# Patient Record
Sex: Female | Born: 1965 | Race: Black or African American | Hispanic: No | Marital: Married | Smoking: Never smoker
Health system: Southern US, Community
[De-identification: ages and names within clinical notes are randomized; demographics above are authoritative.]

## PROBLEM LIST (undated history)

## (undated) DIAGNOSIS — E785 Hyperlipidemia, unspecified: Secondary | ICD-10-CM

## (undated) DIAGNOSIS — G5603 Carpal tunnel syndrome, bilateral upper limbs: Secondary | ICD-10-CM

## (undated) DIAGNOSIS — L709 Acne, unspecified: Secondary | ICD-10-CM

## (undated) DIAGNOSIS — N6019 Diffuse cystic mastopathy of unspecified breast: Secondary | ICD-10-CM

## (undated) DIAGNOSIS — D649 Anemia, unspecified: Secondary | ICD-10-CM

## (undated) DIAGNOSIS — R1310 Dysphagia, unspecified: Secondary | ICD-10-CM

## (undated) DIAGNOSIS — T7840XA Allergy, unspecified, initial encounter: Secondary | ICD-10-CM

## (undated) DIAGNOSIS — Z8742 Personal history of other diseases of the female genital tract: Secondary | ICD-10-CM

## (undated) DIAGNOSIS — M7060 Trochanteric bursitis, unspecified hip: Secondary | ICD-10-CM

## (undated) HISTORY — DX: Anemia, unspecified: D64.9

## (undated) HISTORY — DX: Personal history of other diseases of the female genital tract: Z87.42

## (undated) HISTORY — DX: Acne, unspecified: L70.9

## (undated) HISTORY — DX: Hyperlipidemia, unspecified: E78.5

## (undated) HISTORY — DX: Allergy, unspecified, initial encounter: T78.40XA

## (undated) HISTORY — PX: CARPAL TUNNEL RELEASE: SHX101

## (undated) HISTORY — DX: Diffuse cystic mastopathy of unspecified breast: N60.19

## (undated) HISTORY — DX: Carpal tunnel syndrome, bilateral upper limbs: G56.03

## (undated) HISTORY — DX: Trochanteric bursitis, unspecified hip: M70.60

## (undated) HISTORY — PX: BREAST BIOPSY: SHX20

## (undated) HISTORY — DX: Dysphagia, unspecified: R13.10

---
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
## 1986-12-19 HISTORY — PX: APPENDECTOMY: SHX54

## 2000-09-21 ENCOUNTER — Encounter: Payer: Self-pay | Admitting: Obstetrics and Gynecology

## 2000-09-21 ENCOUNTER — Ambulatory Visit (HOSPITAL_COMMUNITY): Admission: RE | Admit: 2000-09-21 | Discharge: 2000-09-21 | Payer: Self-pay | Admitting: Obstetrics and Gynecology

## 2000-11-13 ENCOUNTER — Encounter: Payer: Self-pay | Admitting: Obstetrics and Gynecology

## 2000-11-13 ENCOUNTER — Ambulatory Visit (HOSPITAL_COMMUNITY): Admission: RE | Admit: 2000-11-13 | Discharge: 2000-11-13 | Payer: Self-pay | Admitting: Obstetrics and Gynecology

## 2000-12-22 ENCOUNTER — Ambulatory Visit (HOSPITAL_COMMUNITY): Admission: RE | Admit: 2000-12-22 | Discharge: 2000-12-22 | Payer: Self-pay | Admitting: Obstetrics and Gynecology

## 2000-12-22 ENCOUNTER — Encounter: Payer: Self-pay | Admitting: Obstetrics and Gynecology

## 2001-01-15 ENCOUNTER — Ambulatory Visit (HOSPITAL_COMMUNITY): Admission: RE | Admit: 2001-01-15 | Discharge: 2001-01-15 | Payer: Self-pay | Admitting: Obstetrics and Gynecology

## 2001-01-15 ENCOUNTER — Encounter: Payer: Self-pay | Admitting: Obstetrics and Gynecology

## 2001-01-19 ENCOUNTER — Inpatient Hospital Stay (HOSPITAL_COMMUNITY): Admission: AD | Admit: 2001-01-19 | Discharge: 2001-01-26 | Payer: Self-pay | Admitting: Obstetrics and Gynecology

## 2001-01-29 ENCOUNTER — Encounter: Admission: RE | Admit: 2001-01-29 | Discharge: 2001-02-28 | Payer: Self-pay | Admitting: Obstetrics and Gynecology

## 2002-04-12 ENCOUNTER — Encounter: Admission: RE | Admit: 2002-04-12 | Discharge: 2002-04-12 | Payer: Self-pay | Admitting: Family Medicine

## 2002-04-12 ENCOUNTER — Encounter: Payer: Self-pay | Admitting: Family Medicine

## 2003-04-22 ENCOUNTER — Encounter: Admission: RE | Admit: 2003-04-22 | Discharge: 2003-07-21 | Payer: Self-pay | Admitting: Endocrinology

## 2003-07-28 ENCOUNTER — Other Ambulatory Visit: Admission: RE | Admit: 2003-07-28 | Discharge: 2003-07-28 | Payer: Self-pay | Admitting: Obstetrics and Gynecology

## 2003-08-04 ENCOUNTER — Encounter: Admission: RE | Admit: 2003-08-04 | Discharge: 2003-11-02 | Payer: Self-pay | Admitting: Endocrinology

## 2003-12-20 HISTORY — PX: TUBAL LIGATION: SHX77

## 2004-02-16 ENCOUNTER — Ambulatory Visit (HOSPITAL_COMMUNITY): Admission: RE | Admit: 2004-02-16 | Discharge: 2004-02-16 | Payer: Self-pay | Admitting: Obstetrics and Gynecology

## 2004-04-02 ENCOUNTER — Ambulatory Visit (HOSPITAL_COMMUNITY): Admission: RE | Admit: 2004-04-02 | Discharge: 2004-04-02 | Payer: Self-pay | Admitting: Obstetrics and Gynecology

## 2004-06-04 ENCOUNTER — Observation Stay (HOSPITAL_COMMUNITY): Admission: AD | Admit: 2004-06-04 | Discharge: 2004-06-05 | Payer: Self-pay | Admitting: Obstetrics and Gynecology

## 2004-07-02 ENCOUNTER — Inpatient Hospital Stay (HOSPITAL_COMMUNITY): Admission: RE | Admit: 2004-07-02 | Discharge: 2004-07-05 | Payer: Self-pay | Admitting: Obstetrics and Gynecology

## 2004-07-02 ENCOUNTER — Encounter (INDEPENDENT_AMBULATORY_CARE_PROVIDER_SITE_OTHER): Payer: Self-pay | Admitting: Specialist

## 2004-07-06 ENCOUNTER — Observation Stay (HOSPITAL_COMMUNITY): Admission: AD | Admit: 2004-07-06 | Discharge: 2004-07-06 | Payer: Self-pay | Admitting: Obstetrics and Gynecology

## 2004-08-10 ENCOUNTER — Other Ambulatory Visit: Admission: RE | Admit: 2004-08-10 | Discharge: 2004-08-10 | Payer: Self-pay | Admitting: Obstetrics and Gynecology

## 2004-12-15 ENCOUNTER — Encounter: Admission: RE | Admit: 2004-12-15 | Discharge: 2004-12-15 | Payer: Self-pay | Admitting: Family Medicine

## 2005-11-14 ENCOUNTER — Other Ambulatory Visit: Admission: RE | Admit: 2005-11-14 | Discharge: 2005-11-14 | Payer: Self-pay | Admitting: Obstetrics and Gynecology

## 2006-07-17 ENCOUNTER — Other Ambulatory Visit: Admission: RE | Admit: 2006-07-17 | Discharge: 2006-07-17 | Payer: Self-pay | Admitting: Family Medicine

## 2006-08-11 ENCOUNTER — Encounter: Admission: RE | Admit: 2006-08-11 | Discharge: 2006-08-11 | Payer: Self-pay | Admitting: Family Medicine

## 2007-05-09 ENCOUNTER — Emergency Department (HOSPITAL_COMMUNITY): Admission: EM | Admit: 2007-05-09 | Discharge: 2007-05-10 | Payer: Self-pay | Admitting: Emergency Medicine

## 2007-08-01 ENCOUNTER — Other Ambulatory Visit: Admission: RE | Admit: 2007-08-01 | Discharge: 2007-08-01 | Payer: Self-pay | Admitting: Family Medicine

## 2007-11-02 ENCOUNTER — Encounter: Admission: RE | Admit: 2007-11-02 | Discharge: 2007-11-02 | Payer: Self-pay | Admitting: Family Medicine

## 2008-08-06 ENCOUNTER — Encounter: Admission: RE | Admit: 2008-08-06 | Discharge: 2008-08-06 | Payer: Self-pay | Admitting: Family Medicine

## 2008-09-18 DIAGNOSIS — M7060 Trochanteric bursitis, unspecified hip: Secondary | ICD-10-CM

## 2008-09-18 HISTORY — DX: Trochanteric bursitis, unspecified hip: M70.60

## 2008-10-08 ENCOUNTER — Other Ambulatory Visit: Admission: RE | Admit: 2008-10-08 | Discharge: 2008-10-08 | Payer: Self-pay | Admitting: Family Medicine

## 2008-10-19 DIAGNOSIS — D649 Anemia, unspecified: Secondary | ICD-10-CM

## 2008-10-19 HISTORY — DX: Anemia, unspecified: D64.9

## 2008-11-04 ENCOUNTER — Encounter: Admission: RE | Admit: 2008-11-04 | Discharge: 2008-11-04 | Payer: Self-pay | Admitting: Family Medicine

## 2009-05-27 ENCOUNTER — Encounter: Admission: RE | Admit: 2009-05-27 | Discharge: 2009-06-24 | Payer: Self-pay | Admitting: Endocrinology

## 2009-07-19 DIAGNOSIS — L709 Acne, unspecified: Secondary | ICD-10-CM

## 2009-07-19 HISTORY — DX: Acne, unspecified: L70.9

## 2009-09-02 ENCOUNTER — Encounter: Admission: RE | Admit: 2009-09-02 | Discharge: 2009-09-02 | Payer: Self-pay | Admitting: Endocrinology

## 2009-10-21 ENCOUNTER — Other Ambulatory Visit: Admission: RE | Admit: 2009-10-21 | Discharge: 2009-10-21 | Payer: Self-pay | Admitting: Family Medicine

## 2009-10-21 LAB — HM PAP SMEAR: HM Pap smear: NORMAL

## 2009-11-05 ENCOUNTER — Encounter: Admission: RE | Admit: 2009-11-05 | Discharge: 2009-11-05 | Payer: Self-pay | Admitting: Family Medicine

## 2009-11-17 ENCOUNTER — Encounter: Admission: RE | Admit: 2009-11-17 | Discharge: 2009-11-17 | Payer: Self-pay | Admitting: Family Medicine

## 2009-11-18 DIAGNOSIS — N6019 Diffuse cystic mastopathy of unspecified breast: Secondary | ICD-10-CM

## 2009-11-18 HISTORY — DX: Diffuse cystic mastopathy of unspecified breast: N60.19

## 2009-11-25 ENCOUNTER — Encounter: Admission: RE | Admit: 2009-11-25 | Discharge: 2009-11-25 | Payer: Self-pay | Admitting: Family Medicine

## 2009-12-19 HISTORY — PX: COLONOSCOPY: SHX174

## 2010-03-02 ENCOUNTER — Encounter: Admission: RE | Admit: 2010-03-02 | Discharge: 2010-03-02 | Payer: Self-pay | Admitting: Family Medicine

## 2010-08-17 ENCOUNTER — Encounter: Admission: RE | Admit: 2010-08-17 | Discharge: 2010-08-17 | Payer: Self-pay | Admitting: Gastroenterology

## 2010-09-18 HISTORY — PX: ENDOMETRIAL ABLATION: SHX621

## 2010-09-29 ENCOUNTER — Ambulatory Visit (HOSPITAL_COMMUNITY): Admission: RE | Admit: 2010-09-29 | Discharge: 2010-09-29 | Payer: Self-pay | Admitting: Obstetrics and Gynecology

## 2010-11-19 ENCOUNTER — Encounter: Admission: RE | Admit: 2010-11-19 | Discharge: 2010-11-19 | Payer: Self-pay | Admitting: Family Medicine

## 2010-11-19 LAB — HM MAMMOGRAPHY

## 2011-01-09 ENCOUNTER — Encounter: Payer: Self-pay | Admitting: Family Medicine

## 2011-03-02 LAB — GLUCOSE, CAPILLARY: Glucose-Capillary: 90 mg/dL (ref 70–99)

## 2011-03-03 LAB — BASIC METABOLIC PANEL
BUN: 10 mg/dL (ref 6–23)
Calcium: 9.4 mg/dL (ref 8.4–10.5)
GFR calc non Af Amer: >60 mL/min (ref >60)
Glucose, Bld: 98 mg/dL (ref 70–99)
Potassium: 4.6 mEq/L (ref 3.5–5.1)
Sodium: 140 mEq/L (ref 135–145)

## 2011-03-03 LAB — CBC
HCT: 38.1 % (ref 36.0–46.0)
MCHC: 33.3 g/dL (ref 30.0–36.0)
RDW: 22 % — ABNORMAL HIGH (ref 11.5–15.5)
WBC: 6.6 10*3/uL (ref 4.0–10.5)

## 2011-05-06 NOTE — Op Note (Signed)
[facility]  Patient:    Vanessa [name], Vanessa [name]                      MRN: [id number] Proc. Date: 01/22/01 Adm. Date:  [id number] Attending:  Leonard Schwartz                           Operative Report  PREOPERATIVE DIAGNOSES:       1. A 37-week gestation.                               2. Diabetes.                               3. Late decelerations on nonstress test.  POSTOPERATIVE DIAGNOSES:      1. A 37-week gestation.                               2. Diabetes.                               3. Late decelerations on nonstress test.  PROCEDURE:                    Amniocentesis for maturity studies.  OBSTETRICIAN:                 Janine Limbo, M.D.  ANESTHETIC:                   Local, Xylocaine.  DISPOSITION:                  [name] is a 45 year old female, gravida 1, para 0, who presents at [redacted] weeks gestation. She had a routine nonstress test that showed late decelerations. Contractions were promoted and the patient again had some late decelerations. The decision was made to proceed with amniocentesis to determine whether or not delivery was appropriate at this gestation. The patient understands the indications for her procedure and she accepts the associated risk.  FINDINGS:                     The patients blood type is A positive. An ultrasound was performed that showed a single intrauterine gestation in cephalic position. The amniotic fluid volume was normal. There were normal fetal heart motions. The placenta was grade 2. A total of 20 cc of clear amniotic fluid was removed. It had a slight amount of vernix.  DESCRIPTION OF PROCEDURE:     An ultrasound was performed and an appropriate fluid pocket was isolated. The patients abdomen was prepped with multiple layers of Betadine and then sterilely draped. Two cc of 1% Xylocaine were instilled in the skin. The spinal needle was placed into the amniotic cavity without difficulty and 20  cc of clear fluid were removed. The patient tolerated her procedure well. A repeat ultrasound was performed and there was no harm noted to the fetus. The fetal heart motion, again, was noted to be normal. A nonstress test was performed and then nonstress test was reactive. The fluid was sent to [facility] for an LS ratio. DD:  01/22/01 TD:  01/23/01 Job: [id number]

## 2011-05-06 NOTE — H&P (Signed)
NAMEGAGE, TREIBER                         ACCOUNT NO.:  [id number]   MEDICAL RECORD NO.:  [id number]                   PATIENT TYPE:  INP   LOCATION:  9158                                 FACILITY:  [facility]   PHYSICIAN:  Hal Morales, M.D.             DATE OF BIRTH:  21-Aug-1966   DATE OF ADMISSION:  06/04/2004  DATE OF DISCHARGE:                                HISTORY & PHYSICAL   HISTORY OF PRESENT ILLNESS:  Ms. Rueckert is a 45 year old gravida 2, para 1-0-0-  1 at 34 weeks, who presented with the onset of nausea, vomiting and diarrhea  approximately 4 p.m.  She denied any viral or food exposure.  She reports  pain in the abdomen, status post vomiting.  She was seen at [facility] today with her cervix closed and long.  Questions cultures were done.  She was treated for BV.  She denies fever, headache, URI symptoms, dysuria,  bleeding or leaking.  She reports positive fetal movement.  Pregnancy has  been remarkable for:  (1) Insulin-dependent diabetes diagnosed in 2000; (2)  previous cesarean section with possible plan for repeat; (3) questionable  last menstrual period.   PRENATAL LABORATORIES:  Blood type is A-positive, Rh antibody negative.  VDRL nonreactive.  Rubella titer positive.  Hepatitis B surface antigen  negative.  HIV nonreactive.  GC and Chlamydia cultures were negative in  August.  Pap was normal.  Varicella titer was noted to be positive.  Hemoglobin on entry into practice was 13.7; it was not reevaluated during  her pregnancy.  EDC of July 17, 2004 was established by ultrasound at 10  weeks secondary to questionable LMP and was in agreement with ultrasound at  approximately 18 weeks.   HISTORY OF PRESENT PREGNANCY:  The patient entered care at approximately 10  weeks.  She had an ultrasound at that time that showed an IUP with an [facility] of  July 17, 2004.  Her diabetes had been followed by Dr. Dorisann Frames.  At  the time of the beginning of her pregnancy,  she was taking 15 units of NPH  at bedtime.  She began then to check her sugars again with fasting blood  sugar, 2-hour p.c.'s, and hemoglobin A1c on December 05, 2003 had been in  the range of 5.  She declined amniocentesis.  She had some back pain at 15  weeks.  Blood sugars remained within reasonable range, but she was using an  insulin pen, 75/25 with 15 of insulin before dinner, again 75/25 with 5  units at breakfast and Humalog 3 units at lunch.  Dr. Talmage Nap was working to  get an insulin pump through [employer] but did have some  resistance.  She was still having some sciatic pain.  A [facility] referral was  accomplished.  She did have an abnormal quad screen which showed a 1-in-45  risk for neural tube defect.  Spine was seen well on an 18-week ultrasound,  as well as an anterior abdominal wall; this, in consult with the  radiologist, decreased her risk of neural tube defect by 97%.  Down's  syndrome risk was 1 in 240; based upon ultrasound, the risk of Down's  syndrome was changed to 1 in 598 and trisomy risk was 1 in 800.  Recalculation of neural tube defect risk was 1 in 80.  She had another  ultrasound at 25 weeks that showed growth at the 50th to 75th percentile  with size equal to dates and normal fluid.  Her cervix was also 3.9.  She  was continuing her insulin regimen and her values of blood sugars were  essentially normal.  Hemoglobin A1c was done in March 2005 and was 5.3 per  the patient; it had been 5.8 back in December.  Another ultrasound was done  at 29 weeks, again showing normal fluid and other values.  Cervix was 3.1  cm.  She was seen in the office today for a regular visit with her cervix  checked, which was closed and long, and treatment was initiated for  bacterial vaginosis.   OBSTETRICAL HISTORY:  In 2002, she had a primary low transverse cesarean  section for a female infant -- weight 6 pounds 8 ounces -- at 37-1/7 weeks;  she had epidural anesthesia.   Induction of labor was attempted but the non-  reassuring fetal heart rate occurred.   MEDICAL HISTORY:  She was on Ortho Tri-Cyclen but stopped prior to  pregnancy.  She reports the usual childhood illnesses, although she was  unsure about her varicella status.  She has occasional yeast infections.  She had been diagnosed with insulin-dependent diabetes in 2000; Dr. Talmage Nap  was her endocrinologist.  She had been on NPH prior to pregnancy.  She also  had a broken clavicle as a child and a broken nose in 1994.  She had an  appendectomy in 1987.   ALLERGIES:  She has no known medication allergies.   FAMILY HISTORY:  Her father had an MI.  Her maternal grandmother was a diet-  controlled diabetic.   GENETIC HISTORY:  Genetic history is remarkable for the patient being age  83.  She had a maternal aunt and cousin that had muscular dystrophy and a  cousin who had sickle cell trait.   SOCIAL HISTORY:  The patient is married to the father of the baby; he is  involved and supportive; his name is Malay Fantroy.  The patient is college-  educated; she is employed as a Designer, television/film set.  Her partner has some  education beyond high school and he is employed with [employer].  She has been  followed by the physician service at [facility].  She denies any  alcohol, drug or tobacco use during this pregnancy.   PHYSICAL EXAM:  VITAL SIGNS:  Blood pressures are 120s to 130s over 72 to  82.  Pulse is 84.  Respirations 20.  Temperature is 97.5.  ABDOMEN:  Fetal monitoring shows reactive tracing with no decelerations.  Occasional uterine contractions were noted initially, then went through  approximately 40 mg of every 5 minutes, now spacing out again.  Abdomen was  soft and nontender, negative rebound and guarding and gravid.  Negative CVA  tenderness was noted.  PELVIC:  Cervix was closed, long and vertex -1, slightly ballotable. EXTREMITIES:  Extremities are within normal limits with deep tendon  reflexes  2+ without clonus.  There  is a trace edema noted.   LABORATORIES:  Labs today:  Clean-catch urine shows specific gravity of  1.025, greater than 80 ketones, 100 of glucose and a negative microscopy.  CBC showed hemoglobin of 12.4, hematocrit of 37.7, white blood cell count of  12.3 and platelets of 293,000; differential showed neutrophils of 85,  absolute granulocytes at 10.5 and lymphocytes at 11.  Comprehensive  metabolic panel was normal except for potassium of 3.3.  Lipase and amylase  are currently pending.   IMPRESSION:  1. Intrauterine pregnancy at 34 weeks.  2. Nausea, vomiting and diarrhea.  3. Insulin-dependent diabetes.  4. Mild hypokalemia.  5. Leukocytosis with a left shift.   PLAN:  1. Admit for 23-hour observation per consult with Dr. Hal Morales as     attending physician.  2. Continuous electronic fetal monitoring.  3. Glucomander orders.  4. IV normal saline with 20 mEq of Kay Ciel per liter at 150 mL per hour.  5. Gallbladder ultrasound in the morning.  6. Zofran 4-8 mg q.8 h. p.r.n. nausea.  7. Blood cultures for temperature greater than or equal to 101.  8. CBC with differential in the morning.  9. M.D.s will follow.     Renaldo Reel Emilee Hero, C.N.M.                   Hal Morales, M.D.    Leeanne Mannan  D:  06/04/2004  T:  06/05/2004  Job:  [id number]

## 2011-05-06 NOTE — H&P (Signed)
NAMESHAYRA, ANTON                         ACCOUNT NO.:  [id number]   MEDICAL RECORD NO.:  [id number]                   PATIENT TYPE:  INP   LOCATION:  NA                                   [facility]ACILITY[facility]  [facility]   PHYSICIAN:  Hal Morales, M.D.             DATE OF BIRTH:  1966-06-26   DATE OF ADMISSION:  DATE OF DISCHARGE:                                HISTORY & PHYSICAL   HISTORY OF PRESENT ILLNESS:  This is a 45 year old gravida 2, para 1-0-0-1  at 37-6/7 weeks who presents for repeat cesarean section.  Pregnancy has  been followed by Dr. Pennie Rushing and remarkable for (1) AMA, (2) previous C  section, (3) insulin-dependent diabetes, (4) unsure dates.   HISTORY OF CURRENT PREGNANCY:  The patient in her care at [redacted] weeks  gestation.  She was previously followed by Dr. Talmage Nap for Endocrinology.  Sugars were well maintained throughout the pregnancy.  Attempts were made to  get an insulin pump for the patient, but resistance was met with insurance  companies.  Her quad screen demonstrated increased risk of open neural tube  defects with a risk of 1/80.  Amniocentesis was declined.  She was admitted  overnight for gastroenteritis in June and did well the rest of the  pregnancy.   PAST OBSTETRICS HISTORY:  Remarkable for cesarean delivery in 2002 of a female  infant at [redacted] weeks gestation weighing 6 pounds 8 ounces for a nonreassuring  fetal heart rate.   PAST MEDICAL HISTORY:  1. Childhood varicella.  2. Pregestational insulin-dependent diabetes.   FAMILY HISTORY:  Father with MI and grandmother with diet controlled  diabetes.   PAST SURGICAL HISTORY:  1. Broken clavicle as a child and broken nose in 1994.  2. Appendectomy in 1987.   GENETIC HISTORY:  Remarkable for the patient's age of 89.  Cousin with  muscular dystrophy.  Cousin with sickle cell trait.   SOCIAL HISTORY:  The patient is married to Marta Antu who is involved and  supportive.  She is of the [facility].  She denies  any alcohol, tobacco  or drug use.   PRENATAL LABORATORIES:  Hemoglobin 13.7, platelets 291,000, blood type A  positive, antibody screen negative, RPR nonreactive, rubella immune,  hepatitis negative, HIV negative, Pap test benign (reactive changes),  gonorrhea negative, chlamydia negative, varicella immune.   ASSESSMENT:  1. Intrauterine pregnancy at 37-6/7 weeks.  2. Pregestational insulin-dependent diabetes.  3. Previous C section, desires repeat.   PLAN:  Admit to operating suites per Dr. Pennie Rushing.  Further orders to follow.     Marie L. Williams, C.N.M.                 Hal Morales, M.D.    MLW/MEDQ  D:  07/02/2004  T:  07/02/2004  Job:  [id number]

## 2011-05-06 NOTE — Op Note (Signed)
NAMECAROL, Vanessa [name]                         ACCOUNT NO.:  [id number]   MEDICAL RECORD NO.:  [id number]                   PATIENT TYPE:  INP   LOCATION:  9113                                 [facility]ACILITY:  [facility]   PHYSICIAN:  Hal Morales, M.D.             DATE OF BIRTH:  Apr 25, 1966   DATE OF PROCEDURE:  07/02/2004  DATE OF DISCHARGE:                                 OPERATIVE REPORT   PREOPERATIVE DIAGNOSES:  1. Intrauterine pregnancy at term.  2. Insulin dependent diabetes.  3. Prior cesarean section with desire for repeat cesarean section.  4. Desire for surgical sterilization.   POSTOPERATIVE DIAGNOSES:  1. Intrauterine pregnancy at term.  2. Insulin dependent diabetes.  3. Prior cesarean section with desire for repeat cesarean section.  4. Desire for surgical sterilization.   PROCEDURE:  Repeat low transverse cesarean section and bilateral tubal  sterilization.   ANESTHESIA:  Spinal.   ESTIMATED BLOOD LOSS:  Less than 750 mL.   COMPLICATIONS:  None.   FINDINGS:  The uterus, tubes and ovaries were normal for the gravid state.  The patient delivered a 6 pound 14 ounce female infant with Apgars of 9 and  9 at one and five minutes, respectively.  The placenta contained an  eccentrically inserted three-vessel cord.   SURGEON:  Hal Morales, M.D.   FIRST ASSISTANT:  Marie L. Williams, C.N.M.   DESCRIPTION OF PROCEDURE:  The patient was taken to the operating room after  appropriate identification and placed on the operating table.  After  placement of a spinal anesthetic, she was placed in the supine position with  a left lateral tilt.  The abdomen and perineum were prepped with multiple  layers of Betadine and a Foley catheter inserted into the bladder and  connected to straight drainage.  The abdomen was draped as a sterile field.  After assurance of adequate spinal anesthesia, the suprapubic region of the  incision was infiltrated with 20 mL of 0.25% Marcaine.   A transverse  incision was made at the site of the previous cesarean section incision and  the abdomen opened in layers.  The peritoneum was entered and the bladder  blade placed.  The uterus was incised approximately 2 cm above the  ureterovesical fold and that incision taken laterally on either side  bluntly.  The infant was delivered from the occiput transverse position and  after having the nares and pharynx suctioned and the cord clamped and cut,  was handed off to the awaiting pediatricians.  The appropriate cord blood  was drawn and the placenta noted to have separated from the uterus and was  removed from the operative field.  Uterine incision was closed with a  running interlocking suture of 0 Vicryl.  An imbricating suture of 0 Vicryl  was placed and hemostasis was adequate.  The left fallopian tube was  identified, followed to its fimbriated end, and grasped at  the isthmic  portion and elevated.  A suture of 2-0 chromic was placed through the  mesosalpinx and tied fore and aft on the knuckle of the isthmic tube and a  second ligature placed proximal to that.  The intervening knuckle of tube  was excised and the cut ends cauterized.  A similar procedure was carried  out on the opposite side.  Hemostasis was noted to be adequate.  The  abdominal peritoneum was closed with running suture of 2-0 Vicryl.  The  rectus muscles were reapproximated in the midline with a figure-of-eight  suture of 2-0 Vicryl.  The rectus fascia was closed with a running suture of  0 Vicryl, then reinforced on either side of midline with figure-of-eight  sutures of 0 Vicryl. The subcutaneous tissue was copiously irrigated and  made hemostatic with Bovie cautery.  The skin incision was reapproximated  with a subcuticular suture of 3-0 Monocryl. Steri-Strips were applied.  A  sterile dressing was applied.  The patient was then taken from the operating  room to the recovery room in satisfactory condition,  having tolerated the  procedure well with sponge and instrument counts correct.  The infant went  to the full-term nursery.   SPECIMENS:  Portions of right and left fallopian tube.                                               Hal Morales, M.D.    VPH/MEDQ  D:  07/02/2004  T:  07/02/2004  Job:  [id number]

## 2011-05-06 NOTE — Discharge Summary (Signed)
Vanessa [name], Vanessa [name]                         ACCOUNT NO.:  [id number]   MEDICAL RECORD NO.:  [id number]                   PATIENT TYPE:  INP   LOCATION:  9158                                 [facility]   PHYSICIAN:  Hal Morales, M.D.             DATE OF BIRTH:  1966-09-22   DATE OF ADMISSION:  06/04/2004  DATE OF DISCHARGE:  06/05/2004                                 DISCHARGE SUMMARY   ADMISSION DIAGNOSES:  1. Intrauterine pregnancy at 34 weeks.  2. Nausea, vomiting, and diarrhea.  3. Insulin-dependent diabetes.  4. Mild hypokalemia.   DISCHARGE DIAGNOSES:  1. Intrauterine pregnancy at 34 weeks.  2. Resolving nausea, vomiting, and diarrhea.  3. Insulin-dependent diabetes mellitus.   HOSPITAL COURSE:  [name] is a 45 year old gravida 2 para 1-0-0-1 at 68  weeks who presented with onset of nausea, vomiting, and diarrhea at  approximately 5 p.m. on June 04, 2004.  She denied viral or food exposures.  Her pregnancy had been followed by the [facility] OB/GYN M.D. service  and had been remarkable for:  1. Insulin-dependent diabetes diagnosed in 2000.  2. Previous C-section.  3. Questionable last menstrual period.  4. Advanced maternal age.   Her fetal heart rate was reactive and reassuring with occasional uterine  contractions.  Cervix was closed.  Clean-catch urinalysis was remarkable for  specific gravity of 1.025 and greater than 80 of ketones and 100 glucose.  CBC:  White blood cell count was 12.3, hematocrit 12.4.  Differential showed  leukocytosis with a left shift, neutrophils were 85, lymph was 11.  Potassium was 3.3.  She was started on IV fluids of normal saline with 20  mEq of potassium at 150/hour and was also put on Glucomander protocol.  She  was to be n.p.o. after midnight in preparation for gallbladder ultrasound in  the morning.  Her vital signs were stable and she was afebrile.  On hospital  day #1 she was feeling better.  She had eaten breakfast  and had not had any  nausea and vomiting for approximately a hour afterwards.  Fetal heart rate  was reactive and reassuring.  She was not having contractions, leaking, or  bleeding.  Her vital signs remained stable; she was afebrile.  Clean-catch  urinalysis showed 40 of ketones and specific gravity of 1.020.  White blood  cell count had decreased to 10.4, neutrophils had decreased to 82,  lymphocytes increased to 13.  Potassium had increased to 3.4.  Ultrasound  showed no acute abnormalities and negative gallbladder.  The patient was  deemed to have received the full benefit of her hospital stay and she was  discharged home.   DISCHARGE INSTRUCTIONS:  Follow up at [facility] for her appointment  next week.     Cam Hai, C.N.M.  Hal Morales, M.D.   KS/MEDQ  D:  06/05/2004  T:  06/07/2004  Job:  [id number]

## 2011-05-06 NOTE — Discharge Summary (Signed)
[name], [name]                         ACCOUNT NO.:  [id number]   MEDICAL RECORD NO.:  [id number]                   PATIENT TYPE:  INP   LOCATION:  9113                                 [facility]   PHYSICIAN:  Janine Limbo, M.D.            DATE OF BIRTH:  Jan 06, 1966   DATE OF ADMISSION:  07/02/2004  DATE OF DISCHARGE:  07/05/2004                                 DISCHARGE SUMMARY   ADMITTING DIAGNOSES:  1. Intrauterine pregnancy at term.  2. Insulin-dependent diabetes.  3. Previous cesarean section with desire for repeat.  4. Denies sterilization.   DISCHARGE DIAGNOSES:  1. Intrauterine pregnancy at term.  2. Insulin-dependent diabetes.  3. Previous cesarean section with desire for repeat.  4. Denies sterilization.   PROCEDURES:  1. Repeat low transverse cesarean section.  2. Bilateral tubal sterilization.   HOSPITAL COURSE:  [name] is a 45 year old gravida 2 para 1-0-0-1 at 11 and  six-sevenths weeks who presented for repeat cesarean section.  Pregnancy had  been remarkable for:  1. Advanced maternal age.  2. Previous C-section.  3. Insulin-dependent diabetes.  4. Unsure dates.  5. Quadruple screen showing an increased risk of neural tub defect but     amniocentesis declined.   The patient was taken to the operating room where a repeat low transverse  cesarean section was performed by Dr. Dierdre Forth under spinal  anesthesia with a tubal ligation, a viable female, weight 6 pounds 14  ounces, Apgars were 9 and 9.  She was taken to recovery room in good  condition.  Infant was taken to the full-term nursery in good condition.  By  postoperative day #1 the patient was doing well.  She was up ad lib, she was  breastfeeding.  Her fasting CBG was 106.  Two-hour postprandials were 103 to  142.  She was being managed with a sliding scale.  Hospital day #2, again  she was doing well.  Fasting was 122.  Two-hour p.c.'s were 103 to 179.  The  decision was  made at discharge to continue her sliding scale insulin and  then the patient was to follow up with Dr. Talmage Nap for further input into  insulin regimen.  By postoperative day #3 - which was July 05, 2004 - the  patient was doing well.  Fasting blood sugar was 112.  Two-hour p.c.'s were  105 to 129.  Her incision was clean, dry, and intact.  Her fundus was firm,  her lochia was scant.  She was tolerating a regular diet without difficulty.  She was deemed to have received full benefit of her hospital stay and was  discharged home.   DISCHARGE INSTRUCTIONS:  Per [facility] OB/GYN handout.   DISCHARGE MEDICATIONS:  1. Motrin 600 mg p.o. q.6h. p.r.n. pain.  2. Tylox one to two p.o. q.3-4h. p.r.n. pain.  3. Insulin on a sliding scale as previously utilized.  FOLLOW-UP:  Discharge follow-up will occur in 6 weeks at [facility].  The patient will also follow up with Dr. Talmage Nap regarding her capillary  blood glucose results at home for further insulin management.     Renaldo Reel Emilee Hero, C.N.M.                   Janine Limbo, M.D.    VLL/MEDQ  D:  07/05/2004  T:  07/05/2004  Job:  [id number]

## 2011-05-06 NOTE — Discharge Summary (Signed)
[facility]  Patient:    Vanessa [name], Vanessa [name]                      MRN: [id number] Adm. Date:  [id number] Disc. Date: [id number] Attending:  Leonard Schwartz Dictator:   Vance Gather Duplantis, C.N.M.                           Discharge Summary  DATE OF BIRTH:                [birth year]  ADMISSION DIAGNOSES:          1. Intrauterine pregnancy at 36-5/7 weeks.                               2. Insulin-dependent diabetes with this                                  pregnancy.                               3. Positive group B streptococcus.                               4. Nonreassuring fetal heart rate tracing and                                  equivocal contraction stress test.  DISCHARGE DIAGNOSES:          1. Intrauterine pregnancy at 36-5/7 weeks.                               2. Insulin-dependent diabetes with this                                  pregnancy.                               3. Positive group B streptococcus.                               4. Nonreassuring fetal heart rate tracing and                                  equivocal contraction stress test.                               5. Persistent late decelerations in labor.                               6. Fetal lung maturity studies that were mature.                               7. Status post  cesarean section for                                  nonreassuring fetal heart tracing.                               8. Occult cord prolapse noted during that                                  cesarean section.                               9. Nuchal cord x 1 noted.                              10. Breast-feeding.                              11. Desires oral contraceptives for                                  contraception.  PROCEDURES THIS ADMISSION:    1. Amniocentesis on January 22, 2001 by                                  Dr. Janine Limbo, with no  complications for fetal lung maturity                                  studies.                               2. Primary low transverse cesarean section for                                  delivery of  viable female infant, named                                  Kevin Fenton, who weighed 6 pounds 8 ounces and                                  had Apgars of 8 and 9 on January 23, 2001                                  by Dr. Dierdre Forth.  HOSPITAL COURSE:              Vanessa [name] is a 45 year old, married, black female, gravida 1, para 0 at 36-5/7 weeks, who was admitted on January 19, 2001 for observation secondary to nonreassuring fetal heart rate tracing noted in the office. She is an insulin-dependent diabetic with this pregnancy, previously being a  diet-controlled diabetic prior to pregnancy. Due to her diabetes, it was felt that she needed to have an amniocentesis for fetal lung maturity prior to attempting induction of labor, and the patient desired to await date and time where the fetal lung maturity studies could be done immediately, and thus, desired to wait until February 4 to have that performed. She was observed with fetal heart rate monitoring continuously through to February 4 and at which point, the amniocentesis was performed and fetal lung maturity studies were sent. These returned showing maturity and, subsequently, the options of cesarean section versus induction were discussed and she elected to proceed with induction of labor. Cytotec was placed on the evening of January 22, 2001. Pitocin was then started in the morning of January 23, 2001 and shortly thereafter she developed some persistent decelerations and the Pitocin was discontinued. Amnioinfusion was started. She was continued on oxygen therapy via face/mask and fetal heart rate improved and Pitocin was again attempted; however, she again developed repetitive late decelerations and failed to progress sufficiently to  anticipate a vaginal delivery. She was then recommended to proceed with a cesarean section for delivery and agreed, and underwent the same for delivery of a viable female infant named Kevin Fenton, who weighed 6 pounds 8 ounces and had Apgars of 8 and 9 by Dr. Dierdre Forth. It was noted during this delivery that the baby had an occult cord prolapse and a nuchal cord x 1.  Postoperatively, the patient has done well. She is ambulating, voiding, and eating without difficulty. Her blood sugars have been well controlled just on diet. She is afebrile and her vital signs are stable. She desires oral contraceptives for birth control. She is deemed ready for discharge.  DISCHARGE INSTRUCTIONS:       Per the [facility] handout.  DISCHARGE MEDICATIONS:        1. Motrin 600 mg p.o. q.6h. p.r.n. for pain.                               2. Tylox one to two p.o. q.4-6h. p.r.n. for                                  pain.                               3. Micronor one p.o. q.d. to start in two                                  weeks.  DISCHARGE LABORATORIES:       Her hemoglobin is 11.5. Her WBC count is 10.5 and her platelets are 294,000.  DISCHARGE FOLLOWUP:           Will be in two to three weeks with her primary M.D. in [facility] and in six weeks or p.r.n. at [facility]. D:  01/26/01 TD:  01/27/01 Job: [id number]

## 2011-05-06 NOTE — Op Note (Signed)
[facility]  Patient:    Vanessa [name], Vanessa [name]                      MRN: [id number] Proc. Date: 01/23/01 Adm. Date:  [id number] Attending:  Leonard Schwartz                           Operative Report  PREOPERATIVE DIAGNOSIS:       1. Intrauterine pregnancy at [redacted] weeks                                  gestation.                               2. Type 2 diabetes, insulin requiring during                                  pregnancy.                               3. Nonreassuring fetal heart rate tracing.                               4. Mature lecithin/sphingomyelin ration at                                  amniocentesis.  POSTOPERATIVE DIAGNOSIS:      1. Intrauterine pregnancy at [redacted] weeks                                  gestation.                               2. Type 2 diabetes, insulin requiring during                                  pregnancy.                               3. Nonreassuring fetal heart rate tracing.                               4. Mature lecithin/sphingomyelin ration at                                  amniocentesis.                               5. Occult cord prolapse and nuchal cord x 1.  OPERATION:                    Primary low transverse cesarean section.  SURGEON:  Vanessa P. Pennie Rushing, M.D.  FIRST ASSISTANT:              Vance Gather Duplantis, C.N.M.  ANESTHESIA:                   Epidural.  ESTIMATED BLOOD LOSS:         750 cc.  COMPLICATIONS:                None.  FINDINGS:                     The uterus, tubes, and ovaries were normal for the gravid state.  The patient was delivered of a female infant, whose name is Kevin Fenton, weighing 6 pounds 8 ounces with Apgars of 8/9 at 1 and 5 minutes, respectively.  DESCRIPTION OF PROCEDURE:     The patient was taken to the operating room after appropriate identification and after discussion with the parents concerning nonreassuring fetal heart rate tracing.   The patient had a Foley catheter and labor epidural in place.  She was placed on the operating table in the supine position with a left lateral tilt.  The abdomen was prepped with multiple layers of Betadine and draped as a sterile field.  After assurance of adequate anesthesia, transverse incision was made in the abdomen and the abdomen opened in layers.  The peritoneum was entered, and the uterus incised approximately 1 cm above the uterovesical fold.  The infant was then delivered from the occiput anterior position with the aid of a Mityvac vacuum extractor.  After having the nares and pharynx suctioned and cord clamped and cut, was handed off to the awaiting pediatricians.  The appropriate cord blood was drawn and the placenta allowed to spontaneously release from the uterus and removed from the operative field.  The uterine incision was closed with a running interlocking suture of 0 Vicryl.  An imbricating suture of 0 Vicryl was placed.  A figure-of-eight suture was placed in the visceral peritoneum to repair it.  Copious irrigation was carried out and hemostasis noted to be adequate.  The abdominal peritoneum was closed with a running suture of 2-0 Vicryl.  The rectus muscles were reapproximated in the midline with figure-of-eight suture of 2-0 Vicryl.  The rectus fascia was closed with a running suture of 0 Vicryl and reinforced on either side of midline with figure-of-eight sutures of 0 Vicryl.  The subcutaneous tissue was irrigated and made hemostatic with Bovie cautery.  Skin staples were applied to the skin incision.  A sterile dressing was applied.  The patient was taken from the operating room to the recovery room in satisfactory condition having tolerated the procedure well with sponge and instrument counts correct.  The infant was taken to the full-term nursery. DD:  01/23/01 TD:  01/24/01 Job: [id number]

## 2011-05-06 NOTE — H&P (Signed)
[facility]  Patient:    Vanessa [name], Vanessa [name]                        MRN: [id number] Adm. Date:  01/19/01 Attending:  Janine Limbo, M.D. Dictator:   Miguel Dibble, C.N.M.                         History and Physical  DATE OF BIRTH:                October 07, 1963  HISTORY OF PRESENT ILLNESS:   This is a 45 year old, gravida 1, para 0, at 36-5/7 weeks, who presented today after having a nonreassuring fetal heart rate strip in the office during a non-stress test for insulin-dependent diabetes mellitus in pregnancy.  She experienced what appeared to be repetitive late decelerations with spontaneous contractions that were occurring during her NST.  She presented in maternity admissions unit for further monitoring and a contraction stress test.  On her initial monitoring strip during some spontaneous mild uterine contractions that were irregular, she had what appeared to be mild decelerations that were late on onset that recovered approximately 60 seconds after the end of her contractions.  After contraction stress test was initiated with Pitocin low dose, and she obtained satisfactory test with three contractions in 10 minutes, she experienced three variable decelerations following each of the three contractions, each one late in onset and late in recovery, each with shoulders before and after the deceleration.  She was admitted for 23-hour observation.  PRENATAL LABORATORY:          At entry to the practice, hemoglobin 12, hematocrit 35.9, platelets 275.  Blood type and Rh A positive, Rh antibody negative, ___________ negative.  VDRL nonreactive.  Rubella titer positive. Hepatitis B surface antigen negative.  HIV nonreactive.  Pap smear within normal limits.  Gonorrhea and Chlamydia cultures negative.  Maternal serum alpha fetoprotein within normal limits.  Group beta strep is positive.  MEDICAL HISTORY:              No known drug allergies.   Preexisting diabetes that was diet controlled in September 2000.  During this pregnancy she has required insulin for control of her diabetes.  Chronic hypertension.  History of a fractured collar bone in elementary school after an accident.  Also, fractured nose repaired without difficulty in 1994.  Appendectomy in 1987. Hospitalized for vomiting due to scar tissue in her appendectomy on several occasions.  FAMILY HISTORY:               Father with MI.  Maternal grandmother with non-insulin-dependent diabetes.  Maternal grandmother with several strokes. Maternal cousins with substance abuse.  Maternal aunt with muscular dystrophy. Cousin with muscular dystrophy.  Maternal first cousin with sickle cell trait.  SOCIAL HISTORY:               African-American, Baptist religion.  Married to [employer].  College graduate.  Works as Designer, television/film set full time.  Father of the baby is a Engineer, maintenance (IT).  Works at [employer] full time.  Stable monogamous relationship.  Denies smoking, alcohol, or drug abuse.  PHYSICAL EXAMINATION:  HEENT:                        Within normal limits.  LUNGS:  Bilaterally clear.  HEART:                        Regular rate and rhythm.  ABDOMEN:                      Soft and nontender.  Contractions, mild to regular.  Some felt by patient.  Fetal heart rate accelerations and variable decelerations intermittent, some with late onset.  EXTREMITIES:                  Trace edema.  DTRs S1.  ASSESSMENT:                   Insulin-dependent diabetic with nonreassuring fetal heart tones and an equivocal contraction stress test.  PLAN:                         With positive group beta strep at 36-5/7 weeks. Plan is to admit for 23-hour observation.  If heart rate continues to be nonreassuring, will consider induction and delivery.  Routine antepartum instructions.  Continue electronic fetal monitoring.  Dr. Stefano Gaul to follow patient.  Continue with  current insulin orders which currently includes insulin 4 times per day, before each meal and at h.s. DD:  01/19/01 TD:  01/19/01 Job: [id number]

## 2011-10-10 ENCOUNTER — Encounter: Payer: Self-pay | Admitting: *Deleted

## 2011-10-12 ENCOUNTER — Ambulatory Visit (INDEPENDENT_AMBULATORY_CARE_PROVIDER_SITE_OTHER): Payer: 59 | Admitting: Family Medicine

## 2011-10-12 ENCOUNTER — Encounter: Payer: Self-pay | Admitting: Family Medicine

## 2011-10-12 DIAGNOSIS — E119 Type 2 diabetes mellitus without complications: Secondary | ICD-10-CM | POA: Insufficient documentation

## 2011-10-12 DIAGNOSIS — E7800 Pure hypercholesterolemia, unspecified: Secondary | ICD-10-CM

## 2011-10-12 DIAGNOSIS — Z23 Encounter for immunization: Secondary | ICD-10-CM

## 2011-10-12 DIAGNOSIS — E559 Vitamin D deficiency, unspecified: Secondary | ICD-10-CM | POA: Insufficient documentation

## 2011-10-12 DIAGNOSIS — D649 Anemia, unspecified: Secondary | ICD-10-CM

## 2011-10-12 DIAGNOSIS — Z0000 Encounter for general adult medical examination without abnormal findings: Secondary | ICD-10-CM

## 2011-10-12 LAB — POCT URINALYSIS DIPSTICK
Bilirubin, UA: NEGATIVE
Leukocytes, UA: NEGATIVE
Protein, UA: NEGATIVE
Spec Grav, UA: 1.02
pH, UA: 5

## 2011-10-12 NOTE — Patient Instructions (Signed)

## 2011-10-12 NOTE — Progress Notes (Signed)
Vanessa [name] is a 45 y.o. female who presents for a complete physical.  She has the following concerns: She sees Dr. Talmage Nap for her diabetes.  Last A1c was <7. Was started on Byetta at last visit (2 months ago), in hopes that it would help her lose weight. Sugars have improved, sugars running 90-110 in the morning, 140's in the afternoon.  Denies hypoglycemia.  Last eye exam was earlier this year.  She has no other specific concerns or complaints. She sees Dr. Pennie Rushing for her GYN care  Immunization History  Administered Date(s) Administered  . Influenza Split 10/21/2009  . Pneumococcal Polysaccharide 07/20/2007  . Tdap 06/18/2006   Last Pap smear: 05/2011 Last mammogram: 11/2010 Last colonoscopy: 05/2010 Last DEXA: never Dentist: twice yearly Ophtho: yearly with [facility] Exercise: softball twice a week, which just recently ended.  Not exercising since.  Past Medical History  Diagnosis Date  . Diabetes mellitus   . Hyperlipidemia   . Carpal tunnel syndrome, bilateral     h/o  . Acne 07/2009    s/p Accutane (derm in [place])  . Trochanteric bursitis 10/09    right  . Anemia 11/09    iron deficient  . Vitamin D deficiency   . Fibrocystic breast 11/2009    biopsy R breast     Past Surgical History  Procedure Date  . Cesarean section 2002, 2005    x 2  . Endometrial ablation 09/2010    Dr. Pennie Rushing  . Tubal ligation 2005  . Carpal tunnel release R 12/08, L 2003    bilateral, Dr. Amanda Pea  . Appendectomy 1988    History   Social History  . Marital Status: Married    Spouse Name: N/A    Number of Children: 2  . Years of Education: N/A   Occupational History  . 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 Smoking status: Never Smoker   . Smokeless tobacco: Never Used  . Alcohol Use: Yes     maybe one drink per year.  . Drug Use: No  . Sexually Active: Yes -- Female partner(s)    Birth  Control/ Protection: Surgical   Other Topics Concern  . Not on file   Social History Narrative   Lives with husband, son, daughter and 1 dog    Family History  Problem Relation Age of Onset  . Heart disease Father     MI in 32's  . Diabetes Maternal Grandmother   . Diabetes Maternal Grandfather   . Cancer Paternal Grandfather     ?colon or prostate   Current outpatient prescriptions:exenatide (BYETTA) 10 MCG/0.04ML SOLN, Inject 10 mcg into the skin 2 (two) times daily with a meal.  , Disp: , Rfl: ;  metFORMIN (GLUCOPHAGE-XR) 500 MG 24 hr tablet, Take 1,000 mg by mouth every evening. , Disp: , Rfl: ;  simvastatin (ZOCOR) 40 MG tablet, Take 40 mg by mouth every other day. , Disp: , Rfl:  Multiple Vitamins-Minerals (MULTIVITAMIN WITH MINERALS) tablet, Take 1 tablet by mouth daily.  , Disp: , Rfl:   Allergies  Allergen Reactions  . Minocycline Rash   ROS:  The patient denies anorexia, fever, weight changes, headaches,  vision changes, decreased hearing, ear pain, sore throat, breast concerns, chest pain, palpitations, dizziness, syncope, dyspnea on exertion, cough, swelling, nausea, vomiting, diarrhea, constipation, abdominal pain, melena, hematochezia, indigestion/heartburn, hematuria, incontinence, dysuria, vaginal discharge, odor or itch,  genital lesions, joint pains, numbness, tingling, weakness, tremor, suspicious skin lesions, depression, anxiety, abnormal bleeding/bruising, or enlarged lymph nodes. +mild allergies recently +weight loss since starting Byetta (max of 159).  No vaginal bleeding since ablation  PHYSICAL EXAM:  BP 110/70  Pulse 68  Ht 5' 3.5" (1.613 m)  Wt 154 lb (69.854 kg)  BMI 26.85 kg/m2  General Appearance:    Alert, cooperative, no distress, appears stated age  Head:    Normocephalic, without obvious abnormality, atraumatic  Eyes:    PERRL, conjunctiva/corneas clear, EOM's intact, fundi    benign  Ears:    Normal TM's and external ear canals  Nose:    Nares normal, mucosa normal, no drainage or sinus   tenderness  Throat:   Lips, mucosa, and tongue normal; teeth and gums normal. Cobblestoning posteriorly  Neck:   Supple, no lymphadenopathy;  thyroid:  no   enlargement/tenderness/nodules; no carotid   bruit or JVD  Back:    Spine nontender, no curvature, ROM normal, no CVA     tenderness  Lungs:     Clear to auscultation bilaterally without wheezes, rales or     ronchi; respirations unlabored  Chest Wall:    No tenderness or deformity   Heart:    Regular rate and rhythm, S1 and S2 normal, no murmur, rub   or gallop  Breast Exam:    Deferred to GYN  Abdomen:     Soft, non-tender, nondistended, normoactive bowel sounds,    no masses, no hepatosplenomegaly  Genitalia:    Deferred to GYN     Extremities:   No clubbing, cyanosis or edema  Pulses:   2+ and symmetric all extremities  Skin:   Skin color, texture, turgor normal, no rashes or lesions  Lymph nodes:   Cervical, supraclavicular, and axillary nodes normal  Neurologic:   CNII-XII intact, normal strength, sensation and gait; reflexes 2+ and symmetric throughout          Psych:   Normal mood, affect, hygiene and grooming.      ASSESSMENT/PLAN:  1. Routine general medical examination at a health care facility  POCT Urinalysis Dipstick, Visual acuity screening  2. Need for prophylactic vaccination and inoculation against influenza  Flu vaccine greater than or equal to 3yo preservative free IM  3. Pure hypercholesterolemia  Lipid panel  4. Type II or unspecified type diabetes mellitus without mention of complication, not stated as uncontrolled  Comprehensive metabolic panel, TSH  5. Unspecified vitamin D deficiency  Vitamin D 25 hydroxy  6. Anemia, unspecified  CBC with Differential   Discussed monthly self breast exams and yearly mammograms; at least 30 minutes of aerobic activity at least 5 days/week; proper sunscreen use reviewed; healthy diet, including goals of calcium and vitamin  D intake and alcohol recommendations (less than or equal to 1 drink/day) reviewed; regular seatbelt use; changing batteries in smoke detectors.  Immunization recommendations discussed--flu shot given, others UTD.  Colonoscopy recommendations reviewed--UTD   Send copies of labs to patient and to Dr. Talmage Nap Anemia should be improved since she had endometrial ablation last year H/o vitamin D deficiency, hasn't been taking supplements.  Reviewed recommendations of calcium and Vitamin D

## 2011-10-13 ENCOUNTER — Encounter: Payer: Self-pay | Admitting: Family Medicine

## 2011-10-13 LAB — CBC WITH DIFFERENTIAL/PLATELET
Eosinophils Absolute: 0 10*3/uL (ref 0.0–0.7)
Eosinophils Relative: 1 % (ref 0–5)
Hemoglobin: 14 g/dL (ref 12.0–15.0)
Lymphocytes Relative: 27 % (ref 12–46)
Lymphs Abs: 1.6 10*3/uL (ref 0.7–4.0)
MCH: 31.4 pg (ref 26.0–34.0)
MCV: 92.8 fL (ref 78.0–100.0)
Monocytes Relative: 5 % (ref 3–12)
Neutrophils Relative %: 68 % (ref 43–77)
RBC: 4.46 MIL/uL (ref 3.87–5.11)
WBC: 5.9 10*3/uL (ref 4.0–10.5)

## 2011-10-13 LAB — LIPID PANEL
Cholesterol: 146 mg/dL (ref 0–200)
HDL: 49 mg/dL (ref >39)
Total CHOL/HDL Ratio: 3 Ratio
Triglycerides: 43 mg/dL (ref <150)
VLDL: 9 mg/dL (ref 0–40)

## 2011-10-13 LAB — COMPREHENSIVE METABOLIC PANEL
AST: 14 U/L (ref 0–37)
Alkaline Phosphatase: 45 U/L (ref 39–117)
BUN: 11 mg/dL (ref 6–23)
Creat: 0.66 mg/dL (ref 0.50–1.10)
Glucose, Bld: 89 mg/dL (ref 70–99)

## 2011-10-13 LAB — TSH: TSH: 0.967 u[IU]/mL (ref 0.350–4.500)

## 2011-10-26 ENCOUNTER — Other Ambulatory Visit: Payer: Self-pay | Admitting: Family Medicine

## 2011-10-26 DIAGNOSIS — Z1231 Encounter for screening mammogram for malignant neoplasm of breast: Secondary | ICD-10-CM

## 2011-11-25 ENCOUNTER — Ambulatory Visit
Admission: RE | Admit: 2011-11-25 | Discharge: 2011-11-25 | Disposition: A | Discharge status: Home / Self Care | Payer: 59 | Source: Ambulatory Visit | Attending: Family Medicine | Admitting: Family Medicine

## 2011-11-25 DIAGNOSIS — Z1231 Encounter for screening mammogram for malignant neoplasm of breast: Secondary | ICD-10-CM

## 2012-04-30 ENCOUNTER — Ambulatory Visit (INDEPENDENT_AMBULATORY_CARE_PROVIDER_SITE_OTHER): Payer: BC Managed Care – PPO | Admitting: Family Medicine

## 2012-04-30 ENCOUNTER — Encounter: Payer: Self-pay | Admitting: Family Medicine

## 2012-04-30 VITALS — BP 94/60 | HR 68 | Ht 63.5 in | Wt 146.0 lb

## 2012-04-30 DIAGNOSIS — B354 Tinea corporis: Secondary | ICD-10-CM

## 2012-04-30 NOTE — Patient Instructions (Signed)
Ringworm, Body [Tinea Corporis] Ringworm is a fungal infection of the skin and hair. Another name for this problem is Tinea Corporis. It has nothing to do with worms. A fungus is an organism that lives on dead cells (the outer layer of skin). It can involve the entire body. It can spread from infected pets. Tinea corporis can be a problem in wrestlers who may get the infection form other players/opponents, equipment and mats. DIAGNOSIS  A skin scraping can be obtained from the affected area and by looking for fungus under the microscope. This is called a KOH examination.  HOME CARE INSTRUCTIONS   Ringworm may be treated with a topical antifungal cream, ointment, or oral medications.   If you are using a cream or ointment, wash infected skin. Dry it completely before application.   Scrub the skin with a buff puff or abrasive sponge using a shampoo with ketoconazole to remove dead skin and help treat the ringworm.   Have your pet treated by your veterinarian if it has the same infection.  SEEK MEDICAL CARE IF:   Your ringworm patch (fungus) continues to spread after 7 days of treatment.   Your rash is not gone in 4 weeks. Fungal infections are slow to respond to treatment. Some redness (erythema) may remain for several weeks after the fungus is gone.   The area becomes red, warm, tender, and swollen beyond the patch. This may be a secondary bacterial (germ) infection.   You have a fever.  Document Released: 12/02/2000 Document Revised: 11/24/2011 Document Reviewed: 05/15/2009 [facility] Patient Information 2012 [place], [place].   Stop using cortisone creams. Instead, get an antifungal medication (either Lamisil or clotrimazole (lotrimin)--these are found in the athlete's foot section of the pharmacy).  Apply cream to the affected area twice daily for at least 2-3 weeks, until completely resolved.  If rash is NOT improving at all with antifungal treatment, return for re-evaluation.

## 2012-04-30 NOTE — Progress Notes (Signed)
Chief Complaint  Patient presents with  . Rash    spot on left side of chest for about 3 weeks, spot has rings and it is itchy. Beginning to get sore.   HPI:  First noticed a small spot about 3 weeks ago, a slight bump.  Doesn't recall it being itchy at first, but has been itchy for the last 2 weeks.  Has been using over-the-counter cortisone cream once or twice daily, but it is getting worse.  She thought initially that it was getting irritated by her bra, but now realizes that it isn't just irritation from clothing.  Denies any recall of a tickbite.  Denies fevers, myalgias, headaches.  Admits that her dog has a dry skin condition and has been scratching a lot.  Denies other exposures to ringworm.  No new medications or other problems  Past Medical History  Diagnosis Date  . Diabetes mellitus   . Hyperlipidemia   . Carpal tunnel syndrome, bilateral     h/o  . Acne 07/2009    s/p Accutane (derm in [place])  . Trochanteric bursitis 10/09    right  . Anemia 11/09    iron deficient  . Vitamin d deficiency   . Fibrocystic breast 11/2009    biopsy R breast    Past Surgical History  Procedure Date  . Cesarean section 2002, 2005    x 2  . Endometrial ablation 09/2010    Dr. Pennie Rushing  . Tubal ligation 2005  . Carpal tunnel release R 12/08, L 2003    bilateral, Dr. Amanda Pea  . Appendectomy 1988   Current Outpatient Prescriptions on File Prior to Visit  Medication Sig Dispense Refill  . metFORMIN (GLUCOPHAGE-XR) 500 MG 24 hr tablet Take 1,000 mg by mouth every evening.        Allergies  Allergen Reactions  . Minocycline Rash   ROS:  Denies fevers, headaches, GI complaints, URI symptoms, joint pains, myalgias or other problems  PHYSICAL EXAM: BP 94/60  Pulse 68  Ht 5' 3.5" (1.613 m)  Wt 146 lb (66.225 kg)  BMI 25.46 kg/m2  Well developed, pleasant female in no distress. L upper chest--3.5 x 2.5 cm lesion with raised, erythematous margins.  Central area is flat, but there is  hyperpigmentation in the central area.  ASSESSMENT/PLAN: 1. Tinea corporis    Treat with OTC antifungals--lamisil or lotrimin BID x 2-3 weeks.  Follow up if symptoms persist or worsen.  Consider getting dog evaluated for skin condition, as it may be the source of her infection (if no other source)

## 2012-07-05 ENCOUNTER — Ambulatory Visit: Payer: Self-pay | Admitting: Obstetrics and Gynecology

## 2012-08-01 ENCOUNTER — Ambulatory Visit: Payer: Self-pay | Admitting: Obstetrics and Gynecology

## 2012-09-05 ENCOUNTER — Ambulatory Visit (INDEPENDENT_AMBULATORY_CARE_PROVIDER_SITE_OTHER): Payer: BC Managed Care – PPO | Admitting: Family Medicine

## 2012-09-05 ENCOUNTER — Encounter: Payer: Self-pay | Admitting: Family Medicine

## 2012-09-05 VITALS — BP 102/70 | HR 72 | Temp 98.2°F | Ht 64.0 in | Wt 148.0 lb

## 2012-09-05 DIAGNOSIS — N644 Mastodynia: Secondary | ICD-10-CM

## 2012-09-05 DIAGNOSIS — Z23 Encounter for immunization: Secondary | ICD-10-CM

## 2012-09-05 NOTE — Progress Notes (Signed)
Chief Complaint  Patient presents with  . Mass    noticed a bump/knot on her chest(right side) she states that she can feel the bump better when she raises her right arm.   HPI: First noticed a swelling/lump to her right chest 2 days ago. It was tender.  It has gone down in size, and is less tender now, compared to when she first noticed it.  It is on her right anterior chest, above breast area, and seems more visible when she raises her arm.  Denies any injury or trauma to the area.  She has had endometrial ablation, so no menstrual cycles.  Recalls having something similar in the past, and so got her mammograms started earlier. She is UTD on her mammograms.  Past Medical History  Diagnosis Date  . Diabetes mellitus   . Hyperlipidemia   . Carpal tunnel syndrome, bilateral     h/o  . Acne 07/2009    s/p Accutane (derm in [place])  . Trochanteric bursitis 10/09    right  . Anemia 11/09    iron deficient  . Vitamin d deficiency   . Fibrocystic breast 11/2009    biopsy R breast   . H/O menorrhagia    Past Surgical History  Procedure Date  . Cesarean section 2002, 2005    x 2  . Endometrial ablation 09/2010    Dr. Pennie [name]  . Tubal ligation 2005  . Carpal tunnel release R 12/08, L 2003    bilateral, Dr. Amanda [name]  . Appendectomy 1988   History   Social History  . Marital Status: Married    Spouse Name: N/A    Number of Children: 2  . Years of Education: N/A   Occupational History  . 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 Smoking status: Never Smoker   . Smokeless tobacco: Never Used  . Alcohol Use: Yes     maybe one drink per year.  . Drug Use: No  . Sexually Active: Yes -- Female partner(s)    Birth Control/ Protection: Surgical   Other Topics Concern  . Not on file   Social History Narrative   Lives with husband, son, daughter and 1 dog   Current Outpatient Prescriptions on File  Prior to Visit  Medication Sig Dispense Refill  . metFORMIN (GLUCOPHAGE-XR) 500 MG 24 hr tablet Take 1,000 mg by mouth every evening.       . simvastatin (ZOCOR) 20 MG tablet Take 20 mg by mouth every evening.       Allergies  Allergen Reactions  . Minocycline Rash   ROS:  Denies breast lumps, nipple discharge.  mammo is UTD.  Denies fevers, nausea, vomiting, diarrhea, skin rashes or other concerns.  PHYSICAL EXAM: BP 102/70  Pulse 72  Temp 98.2 F (36.8 C) (Oral)  Ht 5\' 4"  (1.626 m)  Wt 148 lb (67.132 kg)  BMI 25.40 kg/m2 Well developed female, in no distress R breast--fibroglandular tissue throughout R breast, tender laterally.  Her area of concern is more superior, but still in breast area.  No skin abnormality, warmth, fluctuance. No axillary lymphadenopathy.  L upper chest near axilla--target-shaped appearing lesion--flat.  No flaking, smooth  ASSESSMENT/PLAN:  1. Breast pain in female    2. Need for prophylactic vaccination and inoculation against influenza  Flu vaccine greater than or equal to 3yo preservative free IM    Likely fibrocystic changes, likely hormonal (  still having cycles, but no bleeding due to ablation).  Patient was reassured.  She should recheck next week and expect even further improvement if not complete resolution.  Continue annual mammograms, and if the lump/tenderness is present at the time of her exam, to point it out to the techs at the [facility].  Scarring from prior ringworm--just hyperpigmentation, no evidence of active disease.  Expect eventual fading

## 2012-09-05 NOTE — Patient Instructions (Addendum)
I suspect your area of concern is fibrocystic changes to breast tissue in that area, that can become tender around your menstrual cycle. You are no longer bleeding due to ablation, but your ovaries are still producing estrogen and hormones in a cyclical pattern that still effect the breasts in the same way (you just don't know WHEN to expect those changes--ie breast tenderness, and more lumpiness).  This should resolve within a week, but may recur periodically, (or even monthly). If you have this lump/tenderness at the time of your mammogram, make sure to point it out to the tech.

## 2012-10-10 ENCOUNTER — Ambulatory Visit: Payer: Self-pay | Admitting: Obstetrics and Gynecology

## 2012-10-10 ENCOUNTER — Encounter: Payer: Self-pay | Admitting: Obstetrics and Gynecology

## 2012-10-10 ENCOUNTER — Ambulatory Visit (INDEPENDENT_AMBULATORY_CARE_PROVIDER_SITE_OTHER): Payer: BC Managed Care – PPO | Admitting: Obstetrics and Gynecology

## 2012-10-10 VITALS — BP 102/72 | Ht 64.0 in | Wt 152.0 lb

## 2012-10-10 DIAGNOSIS — Z124 Encounter for screening for malignant neoplasm of cervix: Secondary | ICD-10-CM

## 2012-10-10 DIAGNOSIS — E119 Type 2 diabetes mellitus without complications: Secondary | ICD-10-CM

## 2012-10-10 DIAGNOSIS — E559 Vitamin D deficiency, unspecified: Secondary | ICD-10-CM

## 2012-10-10 DIAGNOSIS — E7800 Pure hypercholesterolemia, unspecified: Secondary | ICD-10-CM

## 2012-10-10 LAB — COMPREHENSIVE METABOLIC PANEL
ALT: 11 U/L (ref 0–35)
AST: 14 U/L (ref 0–37)
Albumin: 4.4 g/dL (ref 3.5–5.2)
BUN: 11 mg/dL (ref 6–23)
CO2: 27 mEq/L (ref 19–32)
Calcium: 9.3 mg/dL (ref 8.4–10.5)
Chloride: 101 mEq/L (ref 96–112)
Potassium: 4 mEq/L (ref 3.5–5.3)

## 2012-10-10 LAB — TSH: TSH: 0.776 u[IU]/mL (ref 0.350–4.500)

## 2012-10-10 LAB — POCT URINALYSIS DIPSTICK
Blood, UA: NEGATIVE
Protein, UA: NEGATIVE
Spec Grav, UA: 1.02
Urobilinogen, UA: NEGATIVE

## 2012-10-10 LAB — LIPID PANEL
HDL: 50 mg/dL (ref >39)
Total CHOL/HDL Ratio: 2.8 Ratio
Triglycerides: 47 mg/dL (ref <150)

## 2012-10-10 NOTE — Progress Notes (Signed)
ANNUAL  Last Pap 06/16/2010  WNL: Yes Regular Periods:no none since ablation  Contraception: BTL  Monthly Breast exam:yes Tetanus<39yrs:yes Nl.Bladder Function:yes Daily BMs:yes Healthy Diet:yes Calcium:no Mammogram:no Date of Mammogram: 11/25/2011 Exercise:yes  Have often Exercise: three times weekly Seatbelt: yes Abuse at home: no Stressful work:no Sigmoid-colonoscopy: 2011 Bone Density: No PCP:  Joselyn Arrow Change in PMH: none Change in ZOX:WRUE  Pt request labs done at last visit with PCP which were Lipid Panel, CMP, Vitamin D, TSH  Subjective:    Vanessa [name] is a 46 y.o. female G2P2000 who presents for annual exam.  The patient has no complaints today.   The following portions of the patient's history were reviewed and updated as appropriate: allergies, current medications, past family history, past medical history, past social history, past surgical history and problem list.  Review of Systems Pertinent items are noted in HPI. Gastrointestinal:No change in bowel habits, no abdominal pain, no rectal bleeding Genitourinary:negative for dysuria, frequency, hematuria, nocturia and urinary incontinence    Objective:     There were no vitals taken for this visit.  Weight:  Wt Readings from Last 1 Encounters:  09/05/12 148 lb (67.132 kg)     BMI: There is no height or weight on file to calculate BMI. General Appearance: Alert, appropriate appearance for age. No acute distress HEENT: Grossly normal Neck / Thyroid: Supple, no masses, nodes or enlargement Lungs: clear to auscultation bilaterally Back: No CVA tenderness Breast Exam: No masses or nodes.No dimpling, nipple retraction or discharge. Cardiovascular: Regular rate and rhythm. S1, S2, no murmur Gastrointestinal: Soft, non-tender, no masses or organomegaly Pelvic Exam: External genitalia: normal general appearance Vaginal: normal rugae Cervix: normal appearance Adnexa: no masses Uterus: upper limits normal  size Rectovaginal: normal rectal, no masses Lymphatic Exam: Non-palpable nodes in neck, clavicular, axillary, or inguinal regions Skin: no rash or abnormalities Neurologic: Normal gait and speech, no tremor  Psychiatric: Alert and oriented, appropriate affect.    Urinalysis:Not done    Assessment:   Type II diabetes, followed by Dr. Talmage Nap Amenorrhea s/p endometrial ablation   Plan:   mammogram pap smear with HR HPV.  If nl, next pap due on or after 2015 Baseline labs for f/u of DM and hypercholesterolemia.  Will share results with Dr. Talmage Nap return annually or prn   Dierdre Forth  MD

## 2012-10-12 LAB — PAP IG AND HPV HIGH-RISK

## 2012-10-24 ENCOUNTER — Other Ambulatory Visit: Payer: Self-pay | Admitting: Family Medicine

## 2012-10-24 DIAGNOSIS — Z1231 Encounter for screening mammogram for malignant neoplasm of breast: Secondary | ICD-10-CM

## 2012-12-07 ENCOUNTER — Ambulatory Visit
Admission: RE | Admit: 2012-12-07 | Discharge: 2012-12-07 | Disposition: A | Discharge status: Home / Self Care | Payer: BC Managed Care – PPO | Source: Ambulatory Visit | Attending: Family Medicine | Admitting: Family Medicine

## 2012-12-07 DIAGNOSIS — Z1231 Encounter for screening mammogram for malignant neoplasm of breast: Secondary | ICD-10-CM

## 2013-07-30 LAB — HM DIABETES EYE EXAM: HM Diabetic Eye Exam: NORMAL

## 2013-08-07 ENCOUNTER — Encounter: Payer: Self-pay | Admitting: *Deleted

## 2013-10-24 ENCOUNTER — Other Ambulatory Visit: Payer: Self-pay

## 2013-11-06 ENCOUNTER — Other Ambulatory Visit: Payer: Self-pay

## 2013-11-06 DIAGNOSIS — Z1231 Encounter for screening mammogram for malignant neoplasm of breast: Secondary | ICD-10-CM

## 2013-12-09 ENCOUNTER — Ambulatory Visit
Admission: RE | Admit: 2013-12-09 | Discharge: 2013-12-09 | Disposition: A | Discharge status: Home / Self Care | Payer: BC Managed Care – PPO | Source: Ambulatory Visit

## 2013-12-09 DIAGNOSIS — Z1231 Encounter for screening mammogram for malignant neoplasm of breast: Secondary | ICD-10-CM

## 2014-01-22 ENCOUNTER — Encounter: Payer: Self-pay | Admitting: Family Medicine

## 2014-01-22 ENCOUNTER — Ambulatory Visit (INDEPENDENT_AMBULATORY_CARE_PROVIDER_SITE_OTHER): Payer: BC Managed Care – PPO | Admitting: Family Medicine

## 2014-01-22 VITALS — BP 102/62 | HR 84 | Ht 64.0 in | Wt 153.0 lb

## 2014-01-22 DIAGNOSIS — R071 Chest pain on breathing: Secondary | ICD-10-CM

## 2014-01-22 DIAGNOSIS — R0789 Other chest pain: Secondary | ICD-10-CM

## 2014-01-22 DIAGNOSIS — M940 Chondrocostal junction syndrome [Tietze]: Secondary | ICD-10-CM

## 2014-01-22 MED ORDER — NAPROXEN 500 MG PO TABS: 500.0000 mg | ORAL_TABLET | Freq: Two times a day (BID) | ORAL | Status: DC

## 2014-01-22 NOTE — Patient Instructions (Addendum)
Costochondritis Costochondritis, sometimes called Tietze syndrome, is a swelling and irritation (inflammation) of the tissue (cartilage) that connects your ribs with your breastbone (sternum). It causes pain in the chest and rib area. Costochondritis usually goes away on its own over time. It can take up to 6 weeks or longer to get better, especially if you are unable to limit your activities. CAUSES  Some cases of costochondritis have no known cause. Possible causes include:  Injury (trauma).  Exercise or activity such as lifting.  Severe coughing. SIGNS AND SYMPTOMS  Pain and tenderness in the chest and rib area.  Pain that gets worse when coughing or taking deep breaths.  Pain that gets worse with specific movements. DIAGNOSIS  Your health care provider will do a physical exam and ask about your symptoms. Chest X-rays or other tests may be done to rule out other problems. TREATMENT  Costochondritis usually goes away on its own over time. Your health care provider may prescribe medicine to help relieve pain. HOME CARE INSTRUCTIONS   Avoid exhausting physical activity. Try not to strain your ribs during normal activity. This would include any activities using chest, abdominal, and side muscles, especially if heavy weights are used.  Apply ice to the affected area for the first 2 days after the pain begins.  Put ice in a plastic bag.  Place a towel between your skin and the bag.  Leave the ice on for 20 minutes, 2 3 times a day.  Only take over-the-counter or prescription medicines as directed by your health care provider. SEEK MEDICAL CARE IF:  You have redness or swelling at the rib joints. These are signs of infection.  Your pain does not go away despite rest or medicine. SEEK IMMEDIATE MEDICAL CARE IF:   Your pain increases or you are very uncomfortable.  You have shortness of breath or difficulty breathing.  You cough up blood.  You have worse chest pains,  sweating, or vomiting.  You have a fever or persistent symptoms for more than 2 3 days.  You have a fever and your symptoms suddenly get worse. MAKE SURE YOU:   Understand these instructions.  Will watch your condition.  Will get help right away if you are not doing well or get worse. Document Released: 09/14/2005 Document Revised: 09/25/2013 Document Reviewed: 07/09/2013 [facility] Patient Information 2014 [employer], [place].  Take the anti-inflammatory twice daily with food until your pain has completely resolved.  Take it for at least a week, but you may take the full 15 days if you need to. You can also use some warm compresses.  If your pain does not resolve, or if you feel like it is getting worse, then the next step is to check a chest x-ray.  Call the office for the order.

## 2014-01-22 NOTE — Progress Notes (Signed)
Subjective:     Patient ID: Vanessa [name], female   DOB: [birth year], 48 y.o.   MRN: [id number]  Vanessa [name] presents for Breast Pain  She noticed a lump in the upper portion of her right breast, above the breast--it swells up and down, going on for at least a year.  She presents today, as it has become more bothersome in the last month or so.  Denies any injury or change in activity.  Denies any pain with movement of her arm, only has pain when she presses on the area.  She will feel a discomfort if she takes a deep breath, which she also feels in her back.  She had 3D mammogram 11/2013 which was normal.  She last saw Dr. Pennie RushingHaygood in 09/2012.  I had seen her the month prior for same complaint of right breast pain, in same location.  Was felt to likely be fibroglandular changes, and likely related to hormonal changes.  She does not have any menstrual cycle, due to having had endometrial ablation in the past.  Past Medical History  Diagnosis Date  . Diabetes mellitus   . Hyperlipidemia   . Carpal tunnel syndrome, bilateral     h/o  . Acne 07/2009    s/p Accutane (derm in [place])  . Trochanteric bursitis 10/09    right  . Anemia 11/09    iron deficient  . Vitamin D deficiency   . Fibrocystic breast 11/2009    biopsy R breast   . H/O menorrhagia    Past Surgical History  Procedure Laterality Date  . Cesarean section  2002, 2005    x 2  . Endometrial ablation  09/2010    Dr. Pennie RushingHaygood  . Tubal ligation  2005  . Carpal tunnel release  R 12/08, L 2003    bilateral, Dr. Amanda PeaGramig  . Appendectomy  1988   History   Social History  . Marital Status: Married    Spouse Name: N/A    Number of Children: 2  . Years of Education: N/A   Occupational History  . 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 Smoking status: Never Smoker   . Smokeless tobacco: Never Used  . Alcohol Use: Yes     Comment: maybe one  drink per year.  . Drug Use: No  . Sexual Activity: Yes    Partners: Male    Birth Control/ Protection: Surgical   Other Topics Concern  . Not on file   Social History Narrative   Lives with husband, son, daughter and 1 dog    Outpatient Encounter Prescriptions as of 01/22/2014  Medication Sig  . BYDUREON 2 MG SUSR Inject 2 mg into the skin once a week.   . metFORMIN (GLUCOPHAGE-XR) 500 MG 24 hr tablet Take 1,000 mg by mouth every evening.   . simvastatin (ZOCOR) 20 MG tablet Take 20 mg by mouth every evening.   Allergies  Allergen Reactions  . Minocycline Rash    Review of Systems  Denies fevers, chills, URI symptoms, exertional chest pain, shortness of breath, cough, nausea, vomiting, bleeding, bruising, rashes.  No breast lumps, nipple discharge, joint pains or other concerns    Objective:     Physical Exam   BP 102/62  Pulse 84  Ht 5\' 4"  (1.626 m)  Wt 153 lb (69.4 kg)  BMI 26.25 kg/m2  She is tender to palpation along costochondral  junction on the left, just at one level, and bony prominence (at manubrium) She is nontender elsewhere at costochondral junctions. Breast exam is entirely normal, nontender, no masses.  No axillary lymphadenopathy Heart: regular rate and rhythm Lungs: clear bilaterally Neck: no lymphadenopathy, thyromegaly or mass     Assessment and Plan        Costochondritis - Plan: naproxen (NAPROSYN) 500 MG tablet  Right-sided chest wall pain  NSAIDs, moist heat.  Reassured. Schedule routine GYN exam (past due)

## 2014-02-27 ENCOUNTER — Encounter: Payer: Self-pay | Admitting: Family Medicine

## 2014-04-09 ENCOUNTER — Encounter: Payer: Self-pay | Admitting: Family Medicine

## 2014-04-09 ENCOUNTER — Ambulatory Visit (INDEPENDENT_AMBULATORY_CARE_PROVIDER_SITE_OTHER): Payer: BC Managed Care – PPO | Admitting: Family Medicine

## 2014-04-09 VITALS — BP 110/72 | HR 68 | Ht 64.0 in | Wt 153.0 lb

## 2014-04-09 DIAGNOSIS — M79609 Pain in unspecified limb: Secondary | ICD-10-CM

## 2014-04-09 DIAGNOSIS — M79671 Pain in right foot: Secondary | ICD-10-CM

## 2014-04-09 DIAGNOSIS — M940 Chondrocostal junction syndrome [Tietze]: Secondary | ICD-10-CM

## 2014-04-09 MED ORDER — NAPROXEN 500 MG PO TABS: 500.0000 mg | ORAL_TABLET | Freq: Two times a day (BID) | ORAL | Status: DC

## 2014-04-09 NOTE — Patient Instructions (Signed)
Morton's Neuroma Neuralgia (nerve pain) or neuroma (benign [non-cancerous] nerve tumor) may develop on any interdigital nerve. The interdigital nerves (nerves between digits) of the foot travel beneath and between the metatarsals (long bones of the fore foot) and pass the nerve endings to the toes. The third interdigital is a common place for a small neuroma to form called Morton's neuroma. Another nerve to be affected commonly is the fourth interdigital nerve. This would be in approximately in the area of the base or ball under the bottom of your fourth toe. This condition occurs more commonly in women and is usually on one side. It is usually first noticed by pain radiating (spreading) to the ball of the foot or to the toes. CAUSES The cause of interdigital neuralgia may be from low grade repetitive trauma (damage caused by an accident) as in activities causing a repeated pounding of the foot (running, jumping etc.). It is also caused by improper footwear or recent loss of the fatty padding on the bottom of the foot. TREATMENT  The condition often resolves (goes away) simply with decreasing activity if that is thought to be the cause. Proper shoes are beneficial. Orthotics (special foot support aids) such as a metatarsal bar are often beneficial. This condition usually responds to conservative therapy, however if surgery is necessary it usually brings complete relief. HOME CARE INSTRUCTIONS   Apply ice to the area of soreness for 15-20 minutes, 03-04 times per day, while awake for the first 2 days. Put ice in a plastic bag and place a towel between the bag of ice and your skin.  Only take over-the-counter or prescription medicines for pain, discomfort, or fever as directed by your caregiver. MAKE SURE YOU:   Understand these instructions.  Will watch your condition.  Will get help right away if you are not doing well or get worse. Document Released: 03/13/2001 Document Revised: 02/27/2012  Document Reviewed: 12/05/2005 [employer] Patient Information 2014 [employer].  

## 2014-04-09 NOTE — Progress Notes (Signed)
Chief Complaint  Patient presents with  . Foot Pain    right foot pain-bottom of foot. Has been for several months and it does come and go.    Pain on the bottom of the right foot started several months ago, but comes and goes.  She thought it could have been related to her shoes, but pain recurred with different shoes.  When she called 2 days ago, pain was very severe.  It is better today.  Sometimes it has hurt related to being on her feet for a long time (doing job fairs), however this time there was no known inciting event.  She reports her diabetes is doing well. [facility] months [facility] by Dr. Talmage NapBalan, in hopes of being able to get off metformin, but she hasn't stopped that yet.  Has f/u scheduled with her.  Past Medical History  Diagnosis Date  . Diabetes mellitus   . Hyperlipidemia   . Carpal tunnel syndrome, bilateral     h/o  . Acne 07/2009    s/p Accutane (derm in [place])  . Trochanteric bursitis 10/09    right  . Anemia 11/09    iron deficient  . Vitamin D deficiency   . Fibrocystic breast 11/2009    biopsy R breast   . H/O menorrhagia    Past Surgical History  Procedure Laterality Date  . Cesarean section  2002, 2005    x 2  . Endometrial ablation  09/2010    Dr. Pennie RushingHaygood  . Tubal ligation  2005  . Carpal tunnel release  R 12/08, L 2003    bilateral, Dr. Amanda PeaGramig  . Appendectomy  1988   History   Social History  . Marital Status: Married    Spouse Name: N/A    Number of Children: 2  . Years of Education: N/A   Occupational History  . 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 Smoking status: Never Smoker   . Smokeless tobacco: Never Used  . Alcohol Use: Yes     Comment: maybe one drink per year.  . Drug Use: No  . Sexual Activity: Yes    Partners: Male    Birth Control/ Protection: Surgical   Other Topics Concern  . Not on file   Social History  Narrative   Lives with husband, son, daughter and 1 dog   ROS:  Denies fevers, chills, URI symptoms, numbness, tingling, bleeding, bruising.  Chest pain resolves, recurs intermittently. Naproxen helped with the chest pain.    PHYSICAL EXAM: BP 110/72  Pulse 68  Ht 5\' 4"  (1.626 m)  Wt 153 lb (69.4 kg)  BMI 26.25 kg/m2 Well developed, pleasant female in no distress  Extremities: L:  Normal, nontender, with 2+ pulses. Right: Area of discomfort is along distal foot, along metatarsals, on the bottom of the foot between first and 2nd metatarsals.  There is no increased pain with compression of metatarsals, or palpable mass, but has point tenderness in this area to deep palpation.  Skin is intact. 2+ pulses, normal sensation  ASSESSMENT/PLAN:  Right foot pain - suspect small neuroma; improved.  refill naproxen for prn use.  Costochondritis - Plan: naproxen (NAPROSYN) 500 MG tablet   F/u with podiatrist if needed for ongoing pain.  Discussed supportive measures (wider shoes, consider orthotic, icing prn when painful, etc).

## 2014-05-07 ENCOUNTER — Telehealth: Payer: Self-pay | Admitting: *Deleted

## 2014-05-07 NOTE — Telephone Encounter (Signed)
Patient called and left message on Friday stating that her right foot is still bothering her. She said you would send her to a specialist if needed. I looked at the note and it says follow up with podiatrist for ongoing pain-I made her an appt, just an FYI.

## 2014-05-19 ENCOUNTER — Ambulatory Visit (INDEPENDENT_AMBULATORY_CARE_PROVIDER_SITE_OTHER): Payer: BC Managed Care – PPO

## 2014-05-19 ENCOUNTER — Ambulatory Visit (INDEPENDENT_AMBULATORY_CARE_PROVIDER_SITE_OTHER): Payer: BC Managed Care – PPO | Admitting: Podiatry

## 2014-05-19 ENCOUNTER — Encounter: Payer: Self-pay | Admitting: Podiatry

## 2014-05-19 VITALS — BP 114/74 | HR 77 | Resp 16 | Ht 65.0 in | Wt 152.0 lb

## 2014-05-19 DIAGNOSIS — M779 Enthesopathy, unspecified: Secondary | ICD-10-CM

## 2014-05-19 MED ORDER — TRIAMCINOLONE ACETONIDE 10 MG/ML IJ SUSP
10.0000 mg | Freq: Once | INTRAMUSCULAR | Status: AC
  Administered 2014-05-19: 10 mg

## 2014-05-19 NOTE — Progress Notes (Signed)
Subjective:     Patient ID: Vanessa [name], female   DOB: 1966-10-31, 48 y.o.   MRN: [id number]  Foot Pain   patient presents stating I have pain underneath my right foot has been present for about 4 months and is bothersome when I try to be active or if him on my foot for periods of time. States that her diabetes is under good control   Review of Systems  All other systems reviewed and are negative.      Objective:   Physical Exam  Nursing note and vitals reviewed. Constitutional: She is oriented to person, place, and time.  Cardiovascular: Intact distal pulses.   Musculoskeletal: Normal range of motion.  Neurological: She is oriented to person, place, and time.  Skin: Skin is warm.   neurovascular status is intact with range of motion of the subtalar midtarsal joint within normal limits and muscle strength adequate. Patient found to have good digital perfusion and is noted to have moderate discomfort in the second metatarsal phalangeal joint upon pressure.     Assessment:     Probable capsulitis right second MPJ    Plan:     H&P and x-rays reviewed. Today I did a proximal nerve block aspirated the second MPJ took a small amount of clear fluid from the joint and injected with a half cc of dexamethasone Kenalog and applied thick plantar pad to reduce stress against the forefoot right area reappoint her recheck in 2 weeks

## 2014-05-19 NOTE — Progress Notes (Signed)
   Subjective:    Patient ID: Vanessa [name], female    DOB: Jul 29, 1966, 48 y.o.   MRN: [id number]  HPI Comments: i have pain in my right foot under my toes. Ive had it for 3 - 4 months or longer. The pain has remained the same. At times it hurts to walk and stand. i have been taking naproxen for it.  Foot Pain      Review of Systems  Genitourinary: Positive for dysuria.  Musculoskeletal:       Difficulty walking  All other systems reviewed and are negative.      Objective:   Physical Exam        Assessment & Plan:

## 2014-06-04 ENCOUNTER — Ambulatory Visit: Payer: BC Managed Care – PPO | Admitting: Podiatry

## 2014-06-12 ENCOUNTER — Ambulatory Visit (INDEPENDENT_AMBULATORY_CARE_PROVIDER_SITE_OTHER): Payer: BC Managed Care – PPO | Admitting: Podiatry

## 2014-06-12 ENCOUNTER — Encounter: Payer: Self-pay | Admitting: Podiatry

## 2014-06-12 VITALS — BP 105/60 | HR 82 | Resp 14 | Ht 64.0 in | Wt 152.0 lb

## 2014-06-12 DIAGNOSIS — M779 Enthesopathy, unspecified: Secondary | ICD-10-CM | POA: Diagnosis not present

## 2014-06-12 NOTE — Progress Notes (Signed)
Subjective:     Patient ID: Vanessa [name], female   DOB: 1966-11-05, 48 y.o.   MRN: [id number]  HPI patient presents stating she is feeling some better but feels like she needs arch support   Review of Systems     Objective:   Physical Exam Neurovascular status intact with discomfort still noted within the plantar heel but improved from previous visit and generalized tendinitis    Assessment:     Fasciitis tendinitis-like condition secondary to foot structural    Plan:     Patient requires orthotics and scanned for custom orthotics to reduce stress against the. Reappoint when returned

## 2014-06-26 ENCOUNTER — Encounter: Payer: Self-pay | Admitting: Family Medicine

## 2014-06-26 ENCOUNTER — Ambulatory Visit (INDEPENDENT_AMBULATORY_CARE_PROVIDER_SITE_OTHER): Payer: BC Managed Care – PPO | Admitting: Family Medicine

## 2014-06-26 VITALS — BP 128/80 | HR 72 | Temp 97.5°F | Ht 64.0 in | Wt 158.0 lb

## 2014-06-26 DIAGNOSIS — R109 Unspecified abdominal pain: Secondary | ICD-10-CM

## 2014-06-26 DIAGNOSIS — R1013 Epigastric pain: Secondary | ICD-10-CM

## 2014-06-26 LAB — POCT URINALYSIS DIPSTICK
Bilirubin, UA: NEGATIVE
Blood, UA: NEGATIVE
GLUCOSE UA: 100
LEUKOCYTES UA: NEGATIVE
NITRITE UA: NEGATIVE
Protein, UA: NEGATIVE
Spec Grav, UA: 1.02
UROBILINOGEN UA: NEGATIVE
pH, UA: 5

## 2014-06-26 MED ORDER — DEXLANSOPRAZOLE 60 MG PO CPDR: 60.0000 mg | DELAYED_RELEASE_CAPSULE | Freq: Every day | ORAL | Status: DC

## 2014-06-26 NOTE — Progress Notes (Signed)
Chief Complaint  Patient presents with  . Abdominal Pain    upper abdominal pain. Bloating and sharp pains in her upper abdomen x several days. Pain was so bad last night she thought she owuld have to go to the hospital the pain as so bad.     She has been having epigastric pain x 2-3 days, the worst last night.  Denies nausea, vomiting, heartburn.  Felt very tight, like a knot that would come and go (kind of like when a baby is kicking). Denies any belching, radiation of pain.  Last night the pain started after she took a walk after eating dinner--ate a hot dog with ketchup and mustard.  She was talking to a nurse friend of hers, and she took an OTC acid relief medication.  She took this about 2pm during the day yesterday, when she had a mild discomfort, prior to getting the more severe pain last night.  The night before (Tuesday night) she had similar pain, while at a banquet at church.  Upper abdominal cramp/tightness started BEFORE eating.  Pain didn't change after eating (no better or worse).  Pain improved on its own, laid down when she got home. Mild discomfort currently.  Denies spicy foods, change in caffeine intake, denies citrus or acidic foods. Denies change in dairy intake, beans, or other dietary changes.  Denies fevers, chills, nausea, vomiting, change in bowels.  No sick contacts. Denies using NSAIDs, aspirin.  Past Medical History  Diagnosis Date  . Diabetes mellitus   . Hyperlipidemia   . Carpal tunnel syndrome, bilateral     h/o  . Acne 07/2009    s/p Accutane (derm in [place])  . Trochanteric bursitis 10/09    right  . Anemia 11/09    iron deficient  . Vitamin D deficiency   . Fibrocystic breast 11/2009    biopsy R breast   . H/O menorrhagia    Past Surgical History  Procedure Laterality Date  . Cesarean section  2002, 2005    x 2  . Endometrial ablation  09/2010    Dr. Pennie RushingHaygood  . Tubal ligation  2005  . Carpal tunnel release  R 12/08, L 2003    bilateral, Dr.  Amanda PeaGramig  . Appendectomy  1988   History   Social History  . Marital Status: Married    Spouse Name: N/A    Number of Children: 2  . Years of Education: N/A   Occupational History  . 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 Smoking status: Never Smoker   . Smokeless tobacco: Never Used  . Alcohol Use: Yes     Comment: maybe one drink per year.  . Drug Use: No  . Sexual Activity: Yes    Partners: Male    Birth Control/ Protection: Surgical   Other Topics Concern  . Not on file   Social History Narrative   Lives with husband, son, daughter and 1 dog   Outpatient Encounter Prescriptions as of 06/26/2014  Medication Sig  . BYDUREON 2 MG SUSR Inject 2 mg into the skin once a week.   . cholecalciferol (VITAMIN D) 1000 UNITS tablet Take 2,000 Units by mouth daily.  . metFORMIN (GLUCOPHAGE-XR) 500 MG 24 hr tablet Take 1,000 mg by mouth every evening.   . Probiotic Product (PROBIOTIC DAILY PO) Take 1 tablet by mouth daily.  . simvastatin (ZOCOR) 20 MG tablet Take 20  mg by mouth every evening.  Marland Kitchen dexlansoprazole (DEXILANT) 60 MG capsule Take 1 capsule (60 mg total) by mouth daily.  . [DISCONTINUED] naproxen (NAPROSYN) 500 MG tablet Take 1 tablet (500 mg total) by mouth 2 (two) times daily with a meal.   Allergies  Allergen Reactions  . Minocycline Rash   ROS:  Denies fevers, chills, nausea, vomiting, bowel changes, chest pain, shortness of breath, bleeding/bruising, rash, URI symptoms, cough or other complaints except as per HPI. She saw podiatrist, and after injection and having fluid removed from toe.  Never really took much of the naproxen.  PHYSICAL EXAM: BP 128/80  Pulse 72  Temp(Src) 97.5 F (36.4 C) (Tympanic)  Ht 5\' 4"  (1.626 m)  Wt 158 lb (71.668 kg)  BMI 27.11 kg/m2 Well developed, pleasant female in no distress Neck: No lymphadenopathy, thyromegaly or mass Heart: regular rate and  rhythm, no murmur Lungs: clear bilaterally Back: no CVA tenderness Abdomen: +epigastric tenderness, and RUQ tenderness.  No hepatosplenomegaly.  Negative Murphy sign. Slightly tender at edge of floating ribs.  No rebound or guarding, no mass Extremities: no edema  ASSESSMENT/PLAN:  Abdominal pain, unspecified abdominal location - Plan: POCT Urinalysis Dipstick  Abdominal pain, epigastric - Plan: dexlansoprazole (DEXILANT) 60 MG capsule  #10 of Dexilant given--take once daily.  Avoid NSAIDs, aspirin, spicy foods, acidic foods, caffeine, alcohol.  Bland diet. Avoid fried/greasy foods. Consider u/s RUQ if worsening RUQ pain. Might have MSK component (at rib margin, tender superficially)--moist heat prn

## 2014-06-26 NOTE — Patient Instructions (Signed)
Take Dexilant once daily before a meal for 10 days.  This is an acid reducing medication--do not use the other medication that you have at home along with this.  Avoid spicy foods, acidic foods (citrus, tomatoes), caffeine.  Avoid gas-producing foods (beans, raw broccoli). Avoid fatty/fried foods  Basically, stick with a bland diet for the next week or so. If you have worsening pain on the right side, we might need to do an ultrasound of the gall bladder.  Return immediately if increasing pain, fevers, vomiting.  There may be a component of musculoskeletal pain at the edge of the floating ribs--if it is sore to the touch (very superficially, not deep), then try a heating pad or moist heat to the area.

## 2014-07-11 ENCOUNTER — Ambulatory Visit (INDEPENDENT_AMBULATORY_CARE_PROVIDER_SITE_OTHER): Payer: BC Managed Care – PPO | Admitting: *Deleted

## 2014-07-11 DIAGNOSIS — M779 Enthesopathy, unspecified: Secondary | ICD-10-CM

## 2014-07-11 NOTE — Patient Instructions (Signed)

## 2014-07-11 NOTE — Progress Notes (Signed)
   Subjective:    Patient ID: Vanessa [name], female    DOB: 1966/10/06, [age] y.o.   MRN: [id number]  HPI PUO AND GIVEN INSTRUCTION.    Review of Systems     Objective:   Physical Exam        Assessment & Plan:

## 2014-07-16 ENCOUNTER — Encounter: Payer: Self-pay | Admitting: Internal Medicine

## 2014-07-22 ENCOUNTER — Encounter: Payer: Self-pay | Admitting: Family Medicine

## 2014-07-31 ENCOUNTER — Telehealth: Payer: Self-pay | Admitting: *Deleted

## 2014-07-31 NOTE — Telephone Encounter (Signed)
Insoles made, not happy with it.  I had spoke to her about heels.

## 2014-08-01 ENCOUNTER — Ambulatory Visit (INDEPENDENT_AMBULATORY_CARE_PROVIDER_SITE_OTHER): Payer: BC Managed Care – PPO | Admitting: *Deleted

## 2014-08-01 DIAGNOSIS — M779 Enthesopathy, unspecified: Secondary | ICD-10-CM

## 2014-08-01 NOTE — Progress Notes (Signed)
   Subjective:    Patient ID: Vanessa [name], female    DOB: 09-29-1966, 48 y.o.   MRN: [id number]                 RT FOOT ORTHOTICS MAKING FOOT HURTS MORE WHEN WALKING, BUT WILL ANOTHER APPT. TO SEE DR. REGAL HPI    Review of Systems     Objective:   Physical Exam        Assessment & Plan:

## 2014-08-06 ENCOUNTER — Encounter: Payer: Self-pay | Admitting: Podiatry

## 2014-08-06 ENCOUNTER — Ambulatory Visit (INDEPENDENT_AMBULATORY_CARE_PROVIDER_SITE_OTHER): Payer: BC Managed Care – PPO | Admitting: Podiatry

## 2014-08-06 VITALS — BP 105/60 | HR 82 | Resp 16

## 2014-08-06 DIAGNOSIS — M779 Enthesopathy, unspecified: Secondary | ICD-10-CM

## 2014-08-06 MED ORDER — TRIAMCINOLONE ACETONIDE 10 MG/ML IJ SUSP
10.0000 mg | Freq: Once | INTRAMUSCULAR | Status: AC
  Administered 2014-08-06: 10 mg

## 2014-08-06 NOTE — Progress Notes (Signed)
Subjective:     Patient ID: Denzil MagnusonGwendolyn D Milles, female   DOB: 09-26-1966, 48 y.o.   MRN: [id number]  HPI patient presents stating my right foot is hurting quite a bit again but does admit that she wore a healed shoe and that's when it started and that she was doing okay as long as she had something to take pressure off the forefoot   Review of Systems     Objective:   Physical Exam Neurovascular status intact with inflammation and pain around the second metatarsophalangeal joint right with orthotics that are well contoured with a thicker pad on the right to try to reduce pressure on the joint surface    Assessment:     Capsulitis of the right second MPJ right that was induced by activity level    Plan:     Reviewed condition and at this time did do a proximal nerve block aspirated the joint and injected with half a cc of dexamethasone Kenalog and discussed possible changes and orthotics depending on how responds. I did dispense a graphite brace for the plantar right foot to try to take pressure off the joint

## 2014-09-03 ENCOUNTER — Ambulatory Visit (INDEPENDENT_AMBULATORY_CARE_PROVIDER_SITE_OTHER): Payer: BC Managed Care – PPO | Admitting: Podiatry

## 2014-09-03 ENCOUNTER — Ambulatory Visit: Payer: BC Managed Care – PPO | Admitting: Podiatry

## 2014-09-03 ENCOUNTER — Telehealth: Payer: Self-pay | Admitting: Family Medicine

## 2014-09-03 ENCOUNTER — Encounter: Payer: Self-pay | Admitting: Podiatry

## 2014-09-03 VITALS — BP 111/65 | HR 72 | Resp 16

## 2014-09-03 DIAGNOSIS — M779 Enthesopathy, unspecified: Secondary | ICD-10-CM

## 2014-09-03 NOTE — Telephone Encounter (Signed)
Patient would like to have nurse call her.  SHe thinks she is developing a yeast infection, has not had one in a long time and has some questions

## 2014-09-03 NOTE — Telephone Encounter (Signed)
Patient scheduled appt for tomorrow 09/04/14.

## 2014-09-04 ENCOUNTER — Ambulatory Visit (INDEPENDENT_AMBULATORY_CARE_PROVIDER_SITE_OTHER): Payer: BC Managed Care – PPO | Admitting: Family Medicine

## 2014-09-04 ENCOUNTER — Encounter: Payer: Self-pay | Admitting: Family Medicine

## 2014-09-04 VITALS — BP 104/68 | HR 64 | Ht 64.0 in | Wt 157.0 lb

## 2014-09-04 DIAGNOSIS — N898 Other specified noninflammatory disorders of vagina: Secondary | ICD-10-CM

## 2014-09-04 DIAGNOSIS — N899 Noninflammatory disorder of vagina, unspecified: Secondary | ICD-10-CM

## 2014-09-04 NOTE — Patient Instructions (Signed)
There was no evidence of yeast infection on exam today--just evidence of irritation/inflammation.  Try using Vagisil for irritation (NOT the yeast medication version of Vagisil).  Let us know if you develop any vaginal discharge or change in symptoms.  Look carefully at products at home to see if something is different (scented/changed) that might be contributing to your symptoms

## 2014-09-04 NOTE — Progress Notes (Signed)
Chief Complaint  Patient presents with  . Vaginal Itching    x 1 week. No discharge, no odor.    Patient presents with complaint of one week of itching, irritation and soreness, externally in vaginal area.  She thinks she has a yeast infection.  Denies vaginal discharge. No pain with intercourse. She thinks it is a yeast infection. She hasn't had one in at least 7-8 years (and does recall having vaginal discharge in the past when diagnosed).  Denies any new contacts/exposures--no new fabric softeners, detergent, feminine hygiene products.  No recent antibiotics.  She reports that her diabetes is well controlled--recalls that her last A1c was 6.8 Got flu shot at work today  Past Medical History  Diagnosis Date  . Diabetes mellitus   . Hyperlipidemia   . Carpal tunnel syndrome, bilateral     h/o  . Acne 07/2009    s/p Accutane (derm in [place])  . Trochanteric bursitis 10/09    right  . Anemia 11/09    iron deficient  . Vitamin D deficiency   . Fibrocystic breast 11/2009    biopsy R breast   . H/O menorrhagia    Past Surgical History  Procedure Laterality Date  . Cesarean section  2002, 2005    x 2  . Endometrial ablation  09/2010    Dr. Pennie Rushing  . Tubal ligation  2005  . Carpal tunnel release  R 12/08, L 2003    bilateral, Dr. Amanda Pea  . Appendectomy  1988   History   Social History  . Marital Status: Married    Spouse Name: N/A    Number of Children: 2  . Years of Education: N/A   Occupational History  . 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 Smoking status: Never Smoker   . Smokeless tobacco: Never Used  . Alcohol Use: Yes     Comment: maybe one drink per year.  . Drug Use: No  . Sexual Activity: Yes    Partners: Male    Birth Control/ Protection: Surgical   Other Topics Concern  . Not on file   Social History Narrative   Lives with husband, son, daughter and 1 dog    Outpatient Encounter Prescriptions as of 09/04/2014  Medication Sig  . cholecalciferol (VITAMIN D) 1000 UNITS tablet Take 2,000 Units by mouth daily.  . Exenatide ER (BYDUREON) 2 MG PEN Inject 1 each into the skin once a week.  . metFORMIN (GLUCOPHAGE) 500 MG tablet Take 500 mg by mouth 2 (two) times daily with a meal.  . Probiotic Product (PROBIOTIC DAILY PO) Take 1 tablet by mouth daily.  . simvastatin (ZOCOR) 20 MG tablet Take 20 mg by mouth every evening.  . [DISCONTINUED] Canagliflozin-Metformin HCl (INVOKAMET) 50-1000 MG TABS Take 1 tablet by mouth 2 (two) times daily.  . [DISCONTINUED] dexlansoprazole (DEXILANT) 60 MG capsule Take 1 capsule (60 mg total) by mouth daily.   Allergies  Allergen Reactions  . Minocycline Rash   ROS:  No fevers, chills, vaginal discharge, pelvic pain, dysuria, hematuria, rashes, bleeding, bruising, URI symptoms, chest pain, headaches or other problems.  PHYSICAL EXAM: BP 104/68  Pulse 64  Ht  (1.626 m)  Wt 157 lb (71.215 kg)  BMI 26.94 kg/m2 Well developed, pleasant female in no distress External genitalia--no erythema, rash, lesions.  There is some inflammation/swelling of labianoted Internally, only some thin white discharge, minimal.  Cervix  is normal, no lesions. No abnormal discharge  ASSESSMENT/PAN:  Vaginal irritation  No evidence of yeast infection on exam. Discussed differential diagnosis Treat with Vagisil prn for irritation (not the yeast kind of Vagisil). Return for re-evaluation if symptoms worsen, change.  If develops thick white discharge, can call for diflucan.

## 2014-09-05 NOTE — Progress Notes (Signed)
Subjective:     Patient ID: Vanessa [name], female   DOB: 1966-07-21, 48 y.o.   MRN: [id number]  HPI patient states that she is doing better with her pain but still gets mild discomfort if she is been on her feet for a long time   Review of Systems     Objective:   Physical Exam Neurovascular status intact with diminished discomfort second metatarsophalangeal joint with orthotics that she's gradually wearing longer but limited to 4-5 hour period of time    Assessment:     Capsulitis which is improving second MPJ right    Plan:     Discussed physical therapy and gradual increase in orthotic usage over the next several months

## 2014-10-20 ENCOUNTER — Encounter: Payer: Self-pay | Admitting: Family Medicine

## 2014-10-22 ENCOUNTER — Encounter: Payer: Self-pay | Admitting: Podiatry

## 2014-10-22 ENCOUNTER — Ambulatory Visit (INDEPENDENT_AMBULATORY_CARE_PROVIDER_SITE_OTHER): Payer: BC Managed Care – PPO | Admitting: Podiatry

## 2014-10-22 VITALS — BP 121/75 | HR 72 | Resp 16

## 2014-10-22 DIAGNOSIS — M779 Enthesopathy, unspecified: Secondary | ICD-10-CM

## 2014-10-22 NOTE — Progress Notes (Signed)
Subjective:     Patient ID: Vanessa [name], female   DOB: [birth year], 48 y.o.   MRN: [id number]  HPIpatient states it's doing better but it is still giving me problems if I been on it for a while and I cannot wear the orthotics in my types of shoes that I need to wear to work and it seems like that bothers   Review of Systems     Objective:   Physical Exam Neurovascular status intact with diminished discomfort second metatarsophalangeal joint right but still present when pressed with movement of the toe causing irritation    Assessment:     Capsulitis still noted second MPJ right that's improved but still present    Plan:     Reviewed condition and scanned for second pair of orthotics that will may be made differently in order to accommodate her dress-type shoes that she needs to wear to work

## 2014-10-23 ENCOUNTER — Encounter: Payer: Self-pay | Admitting: Family Medicine

## 2014-10-23 ENCOUNTER — Ambulatory Visit (INDEPENDENT_AMBULATORY_CARE_PROVIDER_SITE_OTHER): Payer: BC Managed Care – PPO | Admitting: Family Medicine

## 2014-10-23 VITALS — BP 120/76 | HR 80 | Ht 64.0 in | Wt 156.0 lb

## 2014-10-23 DIAGNOSIS — A499 Bacterial infection, unspecified: Secondary | ICD-10-CM

## 2014-10-23 DIAGNOSIS — N760 Acute vaginitis: Secondary | ICD-10-CM

## 2014-10-23 DIAGNOSIS — B373 Candidiasis of vulva and vagina: Secondary | ICD-10-CM

## 2014-10-23 DIAGNOSIS — N898 Other specified noninflammatory disorders of vagina: Secondary | ICD-10-CM

## 2014-10-23 DIAGNOSIS — B3731 Acute candidiasis of vulva and vagina: Secondary | ICD-10-CM

## 2014-10-23 DIAGNOSIS — B9689 Other specified bacterial agents as the cause of diseases classified elsewhere: Secondary | ICD-10-CM

## 2014-10-23 LAB — POCT WET PREP (WET MOUNT): Clue Cells Wet Prep Whiff POC: NEGATIVE

## 2014-10-23 MED ORDER — METRONIDAZOLE 500 MG PO TABS: 500.0000 mg | ORAL_TABLET | Freq: Two times a day (BID) | ORAL | Status: DC

## 2014-10-23 MED ORDER — FLUCONAZOLE 150 MG PO TABS: 150.0000 mg | ORAL_TABLET | Freq: Once | ORAL | Status: DC

## 2014-10-23 NOTE — Progress Notes (Signed)
Chief Complaint  Patient presents with  . Vaginal Discharge    yellow vaginal discharde x 5 weeks. No odor or itching.     Seen 9/17 with vaginal complaints: itching, irritation and soreness, externally in vaginal area. She thought it was a yeast infection, but wet prep was normal. She used Vagisil just for a couple of days, and symptoms resolved.  Since then, she has developed a discharge.  She really hasn't had any discharge since having her ablation, so any discharge is very unusual for her.  Discharge is thin, white, maybe yellowish-white.  Denies any significant odor or itching.   Denies any abdominal or pelvic pain, no pain with intercourse. Monogamous relationship.  PMH, PSH, SH reviewed.  Outpatient Encounter Prescriptions as of 10/23/2014  Medication Sig  . cholecalciferol (VITAMIN D) 1000 UNITS tablet Take 2,000 Units by mouth daily.  . Exenatide ER (BYDUREON) 2 MG PEN Inject 1 each into the skin once a week.  . metFORMIN (GLUCOPHAGE) 500 MG tablet Take 500 mg by mouth 2 (two) times daily with a meal.  . Probiotic Product (PROBIOTIC DAILY PO) Take 1 tablet by mouth daily.  . simvastatin (ZOCOR) 20 MG tablet Take 20 mg by mouth every evening.  . fluconazole (DIFLUCAN) 150 MG tablet Take 1 tablet (150 mg total) by mouth once. Take 1 tablet by mouth for yeast infection.  Repeat in 1 week if needed  . metroNIDAZOLE (FLAGYL) 500 MG tablet Take 1 tablet (500 mg total) by mouth 2 (two) times daily.   Allergies  Allergen Reactions  . Minocycline Rash   ROS:  No fevers, chills, abdominal pain, nausea, vomiting, diarrhea.  No urinary complaints, skin concerns.  She has some right shoulder pain--a little better since not playing softball for the last couple of weeks. She has just started taking some naproxen (previously rx'd for her foot).  PHYSICAL EXAM: BP 120/76 mmHg  Pulse 80  Ht 5\' 4"  (1.626 m)  Wt 156 lb (70.761 kg)  BMI 26.76 kg/m2 Well developed, pleasant female in no  distress External genitalia:  Normal, without lesions, erythema or swelling. There is moderate amount of light yellow vaginal discharge.  Some is very thin/watery, and there are thicker chunks within it, adherent to the cervix and vaginal wall.  Cervix is otherwise normal without lesions or purulent discharge Abdomen: nontender  Wet prep:  Many bacteria, some clue cells.  0-2 PMNs KOH:  Many hyphae noted   ASSESSMENT/PLAN:  Vaginal discharge - Plan: POCT Wet Prep Mellody Drown(Wet [place]), GC/Chlamydia Probe Amp  Yeast vaginitis - Plan: fluconazole (DIFLUCAN) 150 MG tablet  Bacterial vaginosis - Plan: metroNIDAZOLE (FLAGYL) 500 MG tablet   Yeast vaginitis.  There may also be component of bacterial vaginosis. She prefers oral treatments for both problems.  Plan diflucan, repeat in a week if needed. Flagyl BID x 1 week.  Counseled re: side effects and need to abstain from all alcohol. GC and chlamydia sent for reassurance, not likely to be abnormal.

## 2014-10-23 NOTE — Patient Instructions (Signed)
Bacterial Vaginosis Bacterial vaginosis is a vaginal infection that occurs when the normal balance of bacteria in the vagina is disrupted. It results from an overgrowth of certain bacteria. This is the most common vaginal infection in women of childbearing age. Treatment is important to prevent complications, especially in pregnant women, as it can cause a premature delivery. CAUSES  Bacterial vaginosis is caused by an increase in harmful bacteria that are normally present in smaller amounts in the vagina. Several different kinds of bacteria can cause bacterial vaginosis. However, the reason that the condition develops is not fully understood. RISK FACTORS Certain activities or behaviors can put you at an increased risk of developing bacterial vaginosis, including:  Having a new sex partner or multiple sex partners.  Douching.  Using an intrauterine device (IUD) for contraception. Women do not get bacterial vaginosis from toilet seats, bedding, swimming pools, or contact with objects around them. SIGNS AND SYMPTOMS  Some women with bacterial vaginosis have no signs or symptoms. Common symptoms include:  Grey vaginal discharge.  A fishlike odor with discharge, especially after sexual intercourse.  Itching or burning of the vagina and vulva.  Burning or pain with urination. DIAGNOSIS  Your health care provider will take a medical history and examine the vagina for signs of bacterial vaginosis. A sample of vaginal fluid may be taken. Your health care provider will look at this sample under a microscope to check for bacteria and abnormal cells. A vaginal pH test may also be done.  TREATMENT  Bacterial vaginosis may be treated with antibiotic medicines. These may be given in the form of a pill or a vaginal cream. A second round of antibiotics may be prescribed if the condition comes back after treatment.  HOME CARE INSTRUCTIONS   Only take over-the-counter or prescription medicines as  directed by your health care provider.  If antibiotic medicine was prescribed, take it as directed. Make sure you finish it even if you start to feel better.  Do not have sex until treatment is completed.  Tell all sexual partners that you have a vaginal infection. They should see their health care provider and be treated if they have problems, such as a mild rash or itching.  Practice safe sex by using condoms and only having one sex partner. SEEK MEDICAL CARE IF:   Your symptoms are not improving after 3 days of treatment.  You have increased discharge or pain.  You have a fever. MAKE SURE YOU:   Understand these instructions.  Will watch your condition.  Will get help right away if you are not doing well or get worse. FOR MORE INFORMATION  Centers for Disease Control and Prevention, Division of STD Prevention: www.cdc.gov/std American Sexual Health Association (ASHA): www.ashastd.org  Document Released: 12/05/2005 Document Revised: 09/25/2013 Document Reviewed: 07/17/2013 ExitCare Patient Information 2015 [employer]. This information is not intended to replace advice given to you by your health care provider. Make sure you discuss any questions you have with your health care provider. Monilial Vaginitis Vaginitis in a soreness, swelling and redness (inflammation) of the vagina and vulva. Monilial vaginitis is not a sexually transmitted infection. CAUSES  Yeast vaginitis is caused by yeast (candida) that is normally found in your vagina. With a yeast infection, the candida has overgrown in number to a point that upsets the chemical balance. SYMPTOMS   White, thick vaginal discharge.  Swelling, itching, redness and irritation of the vagina and possibly the lips of the vagina (vulva).  Burning or painful   urination.  Painful intercourse. DIAGNOSIS  Things that may contribute to monilial vaginitis are:  Postmenopausal and virginal  states.  Pregnancy.  Infections.  Being tired, sick or stressed, especially if you had monilial vaginitis in the past.  Diabetes. Good control will help lower the chance.  Birth control pills.  Tight fitting garments.  Using bubble bath, feminine sprays, douches or deodorant tampons.  Taking certain medications that kill germs (antibiotics).  Sporadic recurrence can occur if you become ill. TREATMENT  Your caregiver will give you medication.  There are several kinds of anti monilial vaginal creams and suppositories specific for monilial vaginitis. For recurrent yeast infections, use a suppository or cream in the vagina 2 times a week, or as directed.  Anti-monilial or steroid cream for the itching or irritation of the vulva may also be used. Get your caregiver's permission.  Painting the vagina with methylene blue solution may help if the monilial cream does not work.  Eating yogurt may help prevent monilial vaginitis. HOME CARE INSTRUCTIONS   Finish all medication as prescribed.  Do not have sex until treatment is completed or after your caregiver tells you it is okay.  Take warm sitz baths.  Do not douche.  Do not use tampons, especially scented ones.  Wear cotton underwear.  Avoid tight pants and panty hose.  Tell your sexual partner that you have a yeast infection. They should go to their caregiver if they have symptoms such as mild rash or itching.  Your sexual partner should be treated as well if your infection is difficult to eliminate.  Practice safer sex. Use condoms.  Some vaginal medications cause latex condoms to fail. Vaginal medications that harm condoms are:  Cleocin cream.  Butoconazole (Femstat).  Terconazole (Terazol) vaginal suppository.  Miconazole (Monistat) (may be purchased over the counter). SEEK MEDICAL CARE IF:   You have a temperature by mouth above 102 F (38.9 C).  The infection is getting worse after 2 days of  treatment.  The infection is not getting better after 3 days of treatment.  You develop blisters in or around your vagina.  You develop vaginal bleeding, and it is not your menstrual period.  You have pain when you urinate.  You develop intestinal problems.  You have pain with sexual intercourse. Document Released: 09/14/2005 Document Revised: 02/27/2012 Document Reviewed: 05/29/2009 ExitCare Patient Information 2015 [employer]. This information is not intended to replace advice given to you by your health care provider. Make sure you discuss any questions you have with your health care provider.  

## 2014-10-24 LAB — GC/CHLAMYDIA PROBE AMP
CT PROBE, AMP APTIMA: NEGATIVE
GC PROBE AMP APTIMA: NEGATIVE

## 2014-11-12 ENCOUNTER — Telehealth: Payer: Self-pay | Admitting: *Deleted

## 2014-11-12 NOTE — Telephone Encounter (Signed)
Left message letting her know Dr.Knapp's response and asking her to please call me back and let me know if she would still like Dr.Knapp to give her a call this afternoon.

## 2014-11-12 NOTE — Telephone Encounter (Signed)
Patient called and she is still having discharge, almost like she is having a period. She is concerned because she had an ablation years ago. Requesting to speak with you, wants to know if you could call her. Should I just offer her an appt? Please advise.

## 2014-11-12 NOTE — Telephone Encounter (Addendum)
Patient called back and she states that she will schedule appt with her GYN, but would greatly appreciate if you could still give her a call on her cell this afternoon. [phone]

## 2014-11-12 NOTE — Telephone Encounter (Signed)
Discussed with pt, reassured.  She is anxious that she is having ANY discharge (there is no odor, itch or discomfort) and is concerned about having bleeding--hadn't had any spotting or period since having ablation. Told to keep track of bleeding on calendar and to f/u with GYN.  She agrees to do so

## 2014-11-12 NOTE — Telephone Encounter (Signed)
I believe she has a GYN.  I'm happy to call her but it won't be until much later this afternoon.  People can still sometimes bleed despite ablation.  At this point, since I've evaluated and treat her and she isn't better, if she is having ongoing concerns, she should see her GYN.  If she still wants me to call her, I will this afternoon

## 2014-11-21 ENCOUNTER — Other Ambulatory Visit: Payer: BC Managed Care – PPO

## 2014-11-25 ENCOUNTER — Other Ambulatory Visit: Payer: Self-pay

## 2014-11-25 DIAGNOSIS — Z1231 Encounter for screening mammogram for malignant neoplasm of breast: Secondary | ICD-10-CM

## 2014-12-04 ENCOUNTER — Encounter: Payer: Self-pay | Admitting: Podiatry

## 2014-12-31 ENCOUNTER — Ambulatory Visit: Payer: BC Managed Care – PPO | Admitting: Family Medicine

## 2015-01-05 ENCOUNTER — Ambulatory Visit (INDEPENDENT_AMBULATORY_CARE_PROVIDER_SITE_OTHER): Payer: BC Managed Care – PPO | Admitting: Family Medicine

## 2015-01-05 ENCOUNTER — Encounter: Payer: Self-pay | Admitting: Family Medicine

## 2015-01-05 VITALS — BP 122/72 | HR 72 | Ht 64.0 in | Wt 156.0 lb

## 2015-01-05 DIAGNOSIS — L720 Epidermal cyst: Secondary | ICD-10-CM

## 2015-01-05 NOTE — Progress Notes (Signed)
Chief Complaint  Patient presents with  . Cyst    on right inner thigh-not painful but is changing in size, would like to have checked out.    Patient recently noted a lump under the skin on her right thigh.  She is concerned about it being a lymph node, or something else of concern.  She would like it checked out. She denies any discomfort, pain, drainage.  She doesn't shave her legs.  No known trauma or injury.  Does not give injections in this area.  Spring softball will be starting up, and she is having some recurrent pain in her right shoulder.  She is asking for the name of the chiropractor that I recommended to her in the past.  She had gotten excellent results, and feels like she needs to go back.  PMH, PSH, SH reviewed. See meds and allergies  ROS:  No fever, chills, URI symptoms, chest pain, shortness of breath, other rashes/lesions, bleeding, bruising, GI problems or other concerns except as per HPI.  PHYSICAL EXAM: BP 122/72 mmHg  Pulse 72  Ht 5\' 4"  (1.626 m)  Wt 156 lb (70.761 kg)  BMI 26.76 kg/m2  Well developed, pleasant female in no distress.  Accompanied by her husband today Right upper thigh--there is a nontender, subcutaneous nodule.  It measures 1cm in length, less than 1/2 cm in width There does appear to be a central pore.  No overlying erythema, warmth. No fluctuance. No inguinal lymphadenopathy  ASSESSMENT/PLAN:  Epidermal inclusion cyst  Trial of warm compresses.  Reviewed s/sx of infection, and to return for re-eval and treatment if these develop.  She will go back to [facility]/Dr. Vear ClockPhillips for her right shoulder pain.

## 2015-01-05 NOTE — Patient Instructions (Signed)
Epidermal Cyst An epidermal cyst is sometimes called a sebaceous cyst, epidermal inclusion cyst, or infundibular cyst. These cysts usually contain a substance that looks "pasty" or "cheesy" and may have a bad smell. This substance is a protein called keratin. Epidermal cysts are usually found on the face, neck, or trunk. They may also occur in the vaginal area or other parts of the genitalia of both men and women. Epidermal cysts are usually small, painless, slow-growing bumps or lumps that move freely under the skin. It is important not to try to pop them. This may cause an infection and lead to tenderness and swelling. CAUSES  Epidermal cysts may be caused by a deep penetrating injury to the skin or a plugged hair follicle, often associated with acne. SYMPTOMS  Epidermal cysts can become inflamed and cause:  Redness.  Tenderness.  Increased temperature of the skin over the bumps or lumps.  Grayish-white, bad smelling material that drains from the bump or lump. DIAGNOSIS  Epidermal cysts are easily diagnosed by your caregiver during an exam. Rarely, a tissue sample (biopsy) may be taken to rule out other conditions that may resemble epidermal cysts. TREATMENT   Epidermal cysts often get better and disappear on their own. They are rarely ever cancerous.  If a cyst becomes infected, it may become inflamed and tender. This may require opening and draining the cyst. Treatment with antibiotics may be necessary. When the infection is gone, the cyst may be removed with minor surgery.  Small, inflamed cysts can often be treated with antibiotics or by injecting steroid medicines.  Sometimes, epidermal cysts become large and bothersome. If this happens, surgical removal in your caregiver's office may be necessary. HOME CARE INSTRUCTIONS  Only take over-the-counter or prescription medicines as directed by your caregiver.  Take your antibiotics as directed. Finish them even if you start to feel  better. SEEK MEDICAL CARE IF:   Your cyst becomes tender, red, or swollen.  Your condition is not improving or is getting worse.  You have any other questions or concerns. MAKE SURE YOU:  Understand these instructions.  Will watch your condition.  Will get help right away if you are not doing well or get worse. Document Released: 11/05/2004 Document Revised: 02/27/2012 Document Reviewed: 06/13/2011 [employer] Patient Information 2015 [employer]. This information is not intended to replace advice given to you by your health care provider. Make sure you discuss any questions you have with your health care provider.  

## 2015-01-22 ENCOUNTER — Other Ambulatory Visit: Payer: Self-pay | Admitting: Chiropractic Medicine

## 2015-01-22 DIAGNOSIS — M25511 Pain in right shoulder: Secondary | ICD-10-CM

## 2015-01-23 ENCOUNTER — Other Ambulatory Visit: Payer: Self-pay

## 2015-01-23 DIAGNOSIS — Z1231 Encounter for screening mammogram for malignant neoplasm of breast: Secondary | ICD-10-CM

## 2015-01-30 ENCOUNTER — Ambulatory Visit
Admission: RE | Admit: 2015-01-30 | Discharge: 2015-01-30 | Disposition: A | Discharge status: Home / Self Care | Payer: BC Managed Care – PPO | Source: Ambulatory Visit

## 2015-01-30 DIAGNOSIS — Z1231 Encounter for screening mammogram for malignant neoplasm of breast: Secondary | ICD-10-CM

## 2015-02-02 ENCOUNTER — Encounter: Payer: Self-pay | Admitting: Family Medicine

## 2015-02-02 ENCOUNTER — Ambulatory Visit (INDEPENDENT_AMBULATORY_CARE_PROVIDER_SITE_OTHER): Payer: BC Managed Care – PPO | Admitting: Family Medicine

## 2015-02-02 VITALS — BP 112/70 | HR 68 | Ht 64.0 in | Wt 155.0 lb

## 2015-02-02 DIAGNOSIS — J069 Acute upper respiratory infection, unspecified: Secondary | ICD-10-CM

## 2015-02-02 NOTE — Progress Notes (Signed)
Chief Complaint  Patient presents with  . Cough    started Thursday or Friday with sneezing, Has progressed to shoulder blade pain, headache and just not feeling good.   4 days ago she started with "cold" symptoms--sneezing, cough and sore throat.  Symptoms have progressed--back and chest hurt from coughing, throat still hurts.  No fevers.  Cough productive of yellow mucus, nose is draining yellow and white drainage.  No myalgias/arthralgias.  Having a headache between her eyes.  Using Nyquil, and a "concoction" from someone from church--contains ginger, lemon, cola. Sugars have been fine (not elevated during illness).  PMH, PSH, SH reviewed  Outpatient Encounter Prescriptions as of 02/02/2015  Medication Sig Note  . cholecalciferol (VITAMIN D) 1000 UNITS tablet Take 2,000 Units by mouth daily.   . Exenatide ER (BYDUREON) 2 MG PEN Inject 1 each into the skin once a week.   . metFORMIN (GLUCOPHAGE) 500 MG tablet Take 500 mg by mouth 2 (two) times daily with a meal.   . ONE TOUCH ULTRA TEST test strip  02/02/2015: Received from: External Pharmacy  . Probiotic Product (PROBIOTIC DAILY PO) Take 1 tablet by mouth daily.   . Pseudoeph-Doxylamine-DM-APAP (NYQUIL PO) Take 15 mLs by mouth daily.   . simvastatin (ZOCOR) 20 MG tablet Take 20 mg by mouth every evening.   . [DISCONTINUED] INVOKAMET 50-1000 MG TABS  01/05/2015: Received from: External Pharmacy   Allergies  Allergen Reactions  . Minocycline Rash   ROS:  No fevers, chills.  No ear pain, nausea, vomiting, diarrhea.  No fevers. Denies shortness of breath. No bleeding, bruising, rash, arthralgias, myalgias or other complaints.  PHYSICAL EXAM: BP 112/70 mmHg  Pulse 68  Ht 5\' 4"  (1.626 m)  Wt 155 lb (70.308 kg)  BMI 26.59 kg/m2  Mildly ill/tired appearing female with frequent sniffling and throat clearing, in no distress HEENT: PERRL, EOMI, conjunctive clear. TM's and EAC's normal. Nasal mucosa is moderately edematous, no erythema,  clear mucus.  Sinuses are tender x 4. OP is clear without erythema or exudate Neck: no lymphadenopathy or mass Heart: regular rate and rhythm without murmur Lungs: clear bilaterally with good air movement Skin: no rash   ASSESSMENT/PLAN:  Acute upper respiratory infection  Drink plenty of fluids. Use decongestant (ie pseudoephedrine or phenylephrine as in Sudafed). Consider using Mucinex (guaifenesin--expectorant to thin out the mucus). You can use dextromethorphan (DM in many combination medications, like Mucinex DM, or found alone in Delsym syrup)--this is a cough suppressant, to be used if needed for coughing. Use tylenol, and/or ibuprofen OR aleve if needed for pain, headache, fever.  If your symptoms persist/worsen in the next 3-5 days, you may need an antibiotic (ongoing discolored mucus, sinus pain, fever).

## 2015-02-02 NOTE — Patient Instructions (Signed)
  Drink plenty of fluids. Use decongestant (ie pseudoephedrine or phenylephrine as in Sudafed). Consider using Mucinex (guaifenesin--expectorant to thin out the mucus). You can use dextromethorphan (DM in many combination medications, like Mucinex DM, or found alone in Delsym syrup)--this is a cough suppressant, to be used if needed for coughing. Use tylenol, and/or ibuprofen OR aleve if needed for pain, headache, fever.  If your symptoms persist/worsen in the next 3-5 days, you may need an antibiotic (ongoing discolored mucus, sinus pain, fever).

## 2015-02-03 ENCOUNTER — Other Ambulatory Visit: Payer: Self-pay | Admitting: Family Medicine

## 2015-02-03 DIAGNOSIS — R928 Other abnormal and inconclusive findings on diagnostic imaging of breast: Secondary | ICD-10-CM

## 2015-02-04 ENCOUNTER — Other Ambulatory Visit: Payer: BC Managed Care – PPO

## 2015-02-04 ENCOUNTER — Ambulatory Visit
Admission: RE | Admit: 2015-02-04 | Discharge: 2015-02-04 | Disposition: A | Discharge status: Home / Self Care | Payer: BC Managed Care – PPO | Source: Ambulatory Visit | Attending: Chiropractic Medicine | Admitting: Chiropractic Medicine

## 2015-02-04 DIAGNOSIS — M25511 Pain in right shoulder: Secondary | ICD-10-CM

## 2015-02-04 MED ORDER — IOHEXOL 180 MG/ML  SOLN
15.0000 mL | Freq: Once | INTRAMUSCULAR | Status: AC | PRN
  Administered 2015-02-04: 15 mL via INTRA_ARTICULAR

## 2015-02-11 ENCOUNTER — Ambulatory Visit
Admission: RE | Admit: 2015-02-11 | Discharge: 2015-02-11 | Disposition: A | Discharge status: Home / Self Care | Payer: BC Managed Care – PPO | Source: Ambulatory Visit | Attending: Family Medicine | Admitting: Family Medicine

## 2015-02-11 ENCOUNTER — Other Ambulatory Visit: Payer: Self-pay | Admitting: Family Medicine

## 2015-02-11 DIAGNOSIS — R928 Other abnormal and inconclusive findings on diagnostic imaging of breast: Secondary | ICD-10-CM

## 2015-05-14 LAB — HEMOGLOBIN A1C: Hgb A1c MFr Bld: 6.6 % — AB (ref 4.0–6.0)

## 2015-06-15 ENCOUNTER — Other Ambulatory Visit: Payer: Self-pay

## 2015-06-25 ENCOUNTER — Ambulatory Visit (INDEPENDENT_AMBULATORY_CARE_PROVIDER_SITE_OTHER): Payer: BC Managed Care – PPO | Admitting: Family Medicine

## 2015-06-25 ENCOUNTER — Other Ambulatory Visit (HOSPITAL_COMMUNITY)
Admission: RE | Admit: 2015-06-25 | Discharge: 2015-06-25 | Disposition: A | Discharge status: Home / Self Care | Payer: BC Managed Care – PPO | Source: Ambulatory Visit | Attending: Family Medicine | Admitting: Family Medicine

## 2015-06-25 ENCOUNTER — Encounter: Payer: Self-pay | Admitting: Family Medicine

## 2015-06-25 VITALS — BP 118/62 | HR 64 | Ht 65.0 in | Wt 156.4 lb

## 2015-06-25 DIAGNOSIS — E119 Type 2 diabetes mellitus without complications: Secondary | ICD-10-CM | POA: Diagnosis not present

## 2015-06-25 DIAGNOSIS — Z0000 Encounter for general adult medical examination without abnormal findings: Secondary | ICD-10-CM | POA: Diagnosis not present

## 2015-06-25 DIAGNOSIS — E7800 Pure hypercholesterolemia, unspecified: Secondary | ICD-10-CM

## 2015-06-25 DIAGNOSIS — E559 Vitamin D deficiency, unspecified: Secondary | ICD-10-CM | POA: Diagnosis not present

## 2015-06-25 DIAGNOSIS — Z01419 Encounter for gynecological examination (general) (routine) without abnormal findings: Secondary | ICD-10-CM | POA: Diagnosis present

## 2015-06-25 DIAGNOSIS — Z1151 Encounter for screening for human papillomavirus (HPV): Secondary | ICD-10-CM | POA: Diagnosis present

## 2015-06-25 LAB — POCT URINALYSIS DIPSTICK
Bilirubin, UA: NEGATIVE
Blood, UA: NEGATIVE
Leukocytes, UA: NEGATIVE
Nitrite, UA: NEGATIVE
Protein, UA: NEGATIVE
Urobilinogen, UA: NEGATIVE
pH, UA: 6

## 2015-06-25 LAB — TSH: TSH: 0.82 u[IU]/mL (ref 0.350–4.500)

## 2015-06-25 NOTE — Patient Instructions (Addendum)
  HEALTH MAINTENANCE RECOMMENDATIONS:  It is recommended that you get at least 30 minutes of aerobic exercise at least 5 days/week (for weight loss, you may need as much as 60-90 minutes). This can be any activity that gets your heart rate up. This can be divided in 10-15 minute intervals if needed, but try and build up your endurance at least once a week.  Weight bearing exercise is also recommended twice weekly.  Eat a healthy diet with lots of vegetables, fruits and fiber.  "Colorful" foods have a lot of vitamins (ie green vegetables, tomatoes, red peppers, etc).  Limit sweet tea, regular sodas and alcoholic beverages, all of which has a lot of calories and sugar.  Up to 1 alcoholic drink daily may be beneficial for women (unless trying to lose weight, watch sugars).  Drink a lot of water.  Calcium recommendations are 1200-1500 mg daily (1500 mg for postmenopausal women or women without ovaries), and vitamin D 1000 IU daily.  This should be obtained from diet and/or supplements (vitamins), and calcium should not be taken all at once, but in divided doses.  Monthly self breast exams and yearly mammograms for women over the age of 48 is recommended.  Sunscreen of at least SPF 30 should be used on all sun-exposed parts of the skin when outside between the hours of 10 am and 4 pm (not just when at beach or pool, but even with exercise, golf, tennis, and yard work!)  Use a sunscreen that says "broad spectrum" so it covers both UVA and UVB rays, and make sure to reapply every 1-2 hours.  Remember to change the batteries in your smoke detectors when changing your clock times in the spring and fall.  Use your seat belt every time you are in a car, and please drive safely and not be distracted with cell phones and texting while driving.  We will be giving you another tetanus shot next year--if you get a very dirty wound before then, you should get a booster at the time of injury (come see us, if not at an  urgent care).

## 2015-06-25 NOTE — Progress Notes (Signed)
Chief Complaint  Patient presents with  . cpe    fasting cpe, breast and pelvic. will get an appt with eye doctor soon. no other concerns   Vanessa [name] is a 49 y.o. female who presents for a complete physical.    She sees Dr. Talmage Nap for control of her diabetes. She stopped Bydureon about a month ago "I hated it".  Reports her sugars have been good since she stopped it. She increased Metformin back to  BID (from  BID) after stopping the bydureon. She sees the eye doctor yearly. She brings in copies of labs, done 5/26 by Dr. Talmage Nap:  Chem panel was normal except for fasting glucose 133 Normal CBC, Hg 14.6, serum ferritin 109. A1c was 6.6. Chol 147, TG 35, LDL 88, HDL 52. Chol/HDL 2.8 Vitamin D-OH 57.8 No TSH or microalbumin checked (not since 2014 per the records she brought in)  She has previously seen Dr. Pennie Rushing for GYN care. She elects to have her GYN exam done here, as part of her physical today.  She wants a pap smear today.  Right shoulder pain--she hasn't had any pain recently, because she hasn't been playing softball. S/p MR arthrogram 01/2015: IMPRESSION: 1. Mild rotator cuff tendinopathy/tendinosis. No partial or full thickness rotator cuff tear. 2. Intact biceps tendon and glenoid labrum. The glenohumeral ligaments and anterior labrocapsular complex are intact. 3. No findings for bony impingement. 4. Subchondral cystic change noted near the infraspinatus attachment.   Immunization History  Administered Date(s) Administered  . Influenza Split 10/21/2009, 10/12/2011, 09/05/2012, 09/04/2014  . Pneumococcal Polysaccharide-23 07/20/2007  . Tdap 06/18/2006   Last Pap smear: 09/2012 normal, no high risk HPV detected Last mammogram: 01/2015 Last colonoscopy: 05/2010 Last DEXA: never Dentist: twice yearly Ophtho: yearly with [facility] Exercise: softball twice a week--starts next week.  When not softball season, she gets at least 30 active minutes daily, walking,  getting 10K steps at least 4x/week.  No weight-bearing exercise.  Past Medical History  Diagnosis Date  . Diabetes mellitus   . Hyperlipidemia   . Carpal tunnel syndrome, bilateral     h/o  . Acne 07/2009    s/p Accutane (derm in [place])  . Trochanteric bursitis 10/09    right  . Anemia 11/09    iron deficient--resolved after ablation  . Vitamin D deficiency   . Fibrocystic breast 11/2009    biopsy R breast   . H/O menorrhagia     Past Surgical History  Procedure Laterality Date  . Cesarean section  2002, 2005    x 2  . Endometrial ablation  09/2010    Dr. Pennie Rushing  . Tubal ligation  2005  . Carpal tunnel release  R 12/08, L 2003    bilateral, Dr. Amanda Pea  . Appendectomy  1988    History   Social History  . Marital Status: Married    Spouse Name: N/A  . Number of Children: 2  . Years of Education: N/A   Occupational History  . 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 Smoking status: Never Smoker   . Smokeless tobacco: Never Used  . Alcohol Use: Yes     Comment: maybe one drink per year.  . Drug Use: No  . Sexual Activity:    Partners: Male    Birth Control/ Protection: Surgical   Other Topics Concern  . Not on file   Social History Narrative  Lives with husband, son, daughter    Family History  Problem Relation Age of Onset  . Heart disease Father     MI in 55's  . Diabetes Father   . Diabetes Maternal Grandmother   . Diabetes Maternal Grandfather   . Cancer Paternal Grandfather     ?colon or prostate    Outpatient Encounter Prescriptions as of 06/25/2015  Medication Sig Note  . cholecalciferol (VITAMIN D) 1000 UNITS tablet Take 2,000 Units by mouth daily.   . metFORMIN (GLUCOPHAGE) 500 MG tablet Take 1,000 mg by mouth 2 (two) times daily with a meal.    . ONE TOUCH ULTRA TEST test strip  02/02/2015: Received from: External Pharmacy  . Probiotic Product (PROBIOTIC  DAILY PO) Take 1 tablet by mouth daily.   . simvastatin (ZOCOR) 20 MG tablet Take 20 mg by mouth every evening.   . [DISCONTINUED] Pseudoeph-Doxylamine-DM-APAP (NYQUIL PO) Take 15 mLs by mouth daily.   . [DISCONTINUED] Exenatide ER (BYDUREON) 2 MG PEN Inject 1 each into the skin once a week.    No facility-administered encounter medications on file as of 06/25/2015.    Allergies  Allergen Reactions  . Minocycline Rash   ROS: The patient denies anorexia, fever, weight changes, headaches, vision changes, decreased hearing, ear pain, sore throat, breast concerns, chest pain, palpitations, dizziness, syncope, dyspnea on exertion, cough, swelling, nausea, vomiting, diarrhea, constipation, abdominal pain, melena, hematochezia, indigestion/heartburn, hematuria, incontinence, dysuria, vaginal discharge, odor or itch, genital lesions, joint pains, numbness, tingling, weakness, tremor, suspicious skin lesions, depression, anxiety, abnormal bleeding/bruising, or enlarged lymph nodes. No vaginal bleeding or spotting since her ablation. Slight "cramp" in her right flank in the last week or so. No urinary symptoms, abdominal pain. Doesn't bother her enough to take anything for it or try heat.   PHYSICAL EXAM:  BP 118/62 mmHg  Pulse 64  Ht 5\' 5"  (1.651 m)  Wt 156 lb 6.4 oz (70.943 kg)  BMI 26.03 kg/m2  General Appearance:   Alert, cooperative, no distress, appears stated age  Head:   Normocephalic, without obvious abnormality, atraumatic  Eyes:   PERRL, conjunctiva/corneas clear, EOM's intact, fundi   benign  Ears:   Normal TM's and external ear canals  Nose:  Nares normal, mucosa mildly edematous, no erythema, no drainage or sinus tenderness  Throat:  Lips, mucosa, and tongue normal; teeth and gums normal. Cobblestoning posteriorly  Neck:  Supple, no lymphadenopathy; thyroid: no enlargement/tenderness/nodules; no carotid  bruit or JVD  Back:  Spine nontender, no  curvature, ROM normal, no CVA tenderness. Tender at upper portion of lumbar paraspinous muscles, mild spasm. nontender at CVA.  Lungs:   Clear to auscultation bilaterally without wheezes, rales or ronchi; respirations unlabored  Chest Wall:   No tenderness or deformity  Heart:   Regular rate and rhythm, S1 and S2 normal, no murmur, rub  or gallop  Breast Exam:   breasts appear normal, no suspicious masses, no skin or nipple changes or axillary nodes. Benign appearing nevi around left breast, larges of which is above the right nipple   Abdomen:   Soft, non-tender, nondistended, normoactive bowel sounds,   no masses, no hepatosplenomegaly.  Low transverse well healed incision.  Genitalia:   normal external genitalia without lesions.  BUS and vagina normal.  Cervix without lesions; no cervical motion tenderness.  Uterus and adnexa are nontender, no masses. No abnormal vaginal discharge  Rectal:  normal sphincter tone, no masses; heme negative stool  Extremities:  No clubbing, cyanosis  or edema  Pulses:  2+ and symmetric all extremities  Skin:  Skin color, texture, turgor normal, no rashes or lesions  Lymph nodes:  Cervical, supraclavicular, and axillary nodes normal  Neurologic:  CNII-XII intact, normal strength, sensation and gait; reflexes 2+ and symmetric throughout   Psych: Normal mood, affect, hygiene and grooming.        Normal diabetic foot exam.  Urine dip: SG >1.030; no leuks/nitrite. Trace glucose and ketones.   ASSESSMENT/PLAN:  Routine general medical examination at a health care facility - Plan: POCT urinalysis dipstick, Cytology - PAP [place], TSH  Diabetes mellitus type 2, controlled, without complications - controlled; under care of Dr. Talmage NapBalan. asked to have her ophtho send us copies of yearly exams - Plan: TSH, Microalbumin / creatinine urine ratio  Pure hypercholesterolemia -  at goal per labs done at Dr. Willeen CassBalan's office  Vitamin D deficiency - adequately replaced per labs done by Dr. Rondell ReamsBalan--continue supplement daily.  Discussed monthly self breast exams and yearly mammograms; at least 30 minutes of aerobic activity at least 5 days/week, weight-bearing exercise at least 2x/week; proper sunscreen use reviewed; healthy diet, including goals of calcium and vitamin D intake and alcohol recommendations (less than or equal to 1 drink/day) reviewed; regular seatbelt use; changing batteries in smoke detectors. Immunization recommendations discussed--flu shots yearly in the fall.  Will need Td next year. Colonoscopy recommendations reviewed--UTD

## 2015-06-26 LAB — MICROALBUMIN / CREATININE URINE RATIO
CREATININE, URINE: 187.9 mg/dL
Microalb Creat Ratio: 3.7 mg/g (ref 0.0–30.0)
Microalb, Ur: 0.7 mg/dL (ref <2.0)

## 2015-06-29 ENCOUNTER — Encounter: Payer: Self-pay | Admitting: *Deleted

## 2015-06-29 LAB — CYTOLOGY - PAP

## 2015-07-09 ENCOUNTER — Encounter: Payer: Self-pay | Admitting: Family Medicine

## 2015-08-03 ENCOUNTER — Encounter: Payer: Self-pay | Admitting: Family Medicine

## 2015-08-03 ENCOUNTER — Ambulatory Visit (INDEPENDENT_AMBULATORY_CARE_PROVIDER_SITE_OTHER): Payer: BC Managed Care – PPO | Admitting: Family Medicine

## 2015-08-03 VITALS — BP 108/64 | HR 80 | Ht 65.0 in | Wt 160.0 lb

## 2015-08-03 DIAGNOSIS — M609 Myositis, unspecified: Secondary | ICD-10-CM | POA: Diagnosis not present

## 2015-08-03 DIAGNOSIS — M25531 Pain in right wrist: Secondary | ICD-10-CM | POA: Diagnosis not present

## 2015-08-03 DIAGNOSIS — J029 Acute pharyngitis, unspecified: Secondary | ICD-10-CM | POA: Diagnosis not present

## 2015-08-03 DIAGNOSIS — M791 Myalgia: Secondary | ICD-10-CM | POA: Diagnosis not present

## 2015-08-03 DIAGNOSIS — IMO0001 Reserved for inherently not codable concepts without codable children: Secondary | ICD-10-CM

## 2015-08-03 LAB — CBC WITH DIFFERENTIAL/PLATELET
BASOS PCT: 0 % (ref 0–1)
Basophils Absolute: 0 10*3/uL (ref 0.0–0.1)
EOS ABS: 0.3 10*3/uL (ref 0.0–0.7)
Eosinophils Relative: 4 % (ref 0–5)
HCT: 42.7 % (ref 36.0–46.0)
Hemoglobin: 14.3 g/dL (ref 12.0–15.0)
Lymphocytes Relative: 28 % (ref 12–46)
Lymphs Abs: 1.8 10*3/uL (ref 0.7–4.0)
MCH: 31 pg (ref 26.0–34.0)
MCHC: 33.5 g/dL (ref 30.0–36.0)
MCV: 92.4 fL (ref 78.0–100.0)
MPV: 9.7 fL (ref 8.6–12.4)
Monocytes Absolute: 0.3 10*3/uL (ref 0.1–1.0)
Monocytes Relative: 5 % (ref 3–12)
NEUTROS ABS: 4.2 10*3/uL (ref 1.7–7.7)
Neutrophils Relative %: 63 % (ref 43–77)
Platelets: 277 10*3/uL (ref 150–400)
RBC: 4.62 MIL/uL (ref 3.87–5.11)
RDW: 13.4 % (ref 11.5–15.5)
WBC: 6.6 10*3/uL (ref 4.0–10.5)

## 2015-08-03 LAB — CK: CK TOTAL: 132 U/L (ref 7–177)

## 2015-08-03 NOTE — Patient Instructions (Addendum)
Stay well hydrated in this heat, and avoid the sun. If you have another episode, use an antihistamine (benadryl, claritin or zyrtec), and use hydrocortisone cream if there is rash and itching.  Your nose was swollen, so I think that your throat symptoms are related to post nasal drainage.  It also seems like you have some sinus congestion. Try sinus rinses, and antihistamines (ie claritin/zyrtec).  Constipation--drink more fluids, high fiber diet. You can increase the stool softener to every day (and some people need to take 2 colace every day).  Constipation Constipation is when a person has fewer than three bowel movements a week, has difficulty having a bowel movement, or has stools that are dry, hard, or larger than normal. As people grow older, constipation is more common. If you try to fix constipation with medicines that make you have a bowel movement (laxatives), the problem may get worse. Long-term laxative use may cause the muscles of the colon to become weak. A low-fiber diet, not taking in enough fluids, and taking certain medicines may make constipation worse.  CAUSES   Certain medicines, such as antidepressants, pain medicine, iron supplements, antacids, and water pills.   Certain diseases, such as diabetes, irritable bowel syndrome (IBS), thyroid disease, or depression.   Not drinking enough water.   Not eating enough fiber-rich foods.   Stress or travel.   Lack of physical activity or exercise.   Ignoring the urge to have a bowel movement.   Using laxatives too much.  SIGNS AND SYMPTOMS   Having fewer than three bowel movements a week.   Straining to have a bowel movement.   Having stools that are hard, dry, or larger than normal.   Feeling full or bloated.   Pain in the lower abdomen.   Not feeling relief after having a bowel movement.  DIAGNOSIS  Your health care provider will take a medical history and perform a physical exam. Further testing  may be done for severe constipation. Some tests may include:  A barium enema X-ray to examine your rectum, colon, and, sometimes, your small intestine.   A sigmoidoscopy to examine your lower colon.   A colonoscopy to examine your entire colon. TREATMENT  Treatment will depend on the severity of your constipation and what is causing it. Some dietary treatments include drinking more fluids and eating more fiber-rich foods. Lifestyle treatments may include regular exercise. If these diet and lifestyle recommendations do not help, your health care provider may recommend taking over-the-counter laxative medicines to help you have bowel movements. Prescription medicines may be prescribed if over-the-counter medicines do not work.  HOME CARE INSTRUCTIONS   Eat foods that have a lot of fiber, such as fruits, vegetables, whole grains, and beans.  Limit foods high in fat and processed sugars, such as french fries, hamburgers, cookies, candies, and soda.   A fiber supplement may be added to your diet if you cannot get enough fiber from foods.   Drink enough fluids to keep your urine clear or pale yellow.   Exercise regularly or as directed by your health care provider.   Go to the restroom when you have the urge to go. Do not hold it.   Only take over-the-counter or prescription medicines as directed by your health care provider. Do not take other medicines for constipation without talking to your health care provider first.  SEEK IMMEDIATE MEDICAL CARE IF:   You have bright red blood in your stool.   Your constipation lasts for  more than 4 days or gets worse.   You have abdominal or rectal pain.   You have thin, pencil-like stools.   You have unexplained weight loss. MAKE SURE YOU:   Understand these instructions.  Will watch your condition.  Will get help right away if you are not doing well or get worse. Document Released: 09/02/2004 Document Revised: 12/10/2013  Document Reviewed: 09/16/2013 [facility] Patient Information 2015 [place], [place]. This information is not intended to replace advice given to you by your health care provider. Make sure you discuss any questions you have with your health care provider.

## 2015-08-03 NOTE — Progress Notes (Signed)
Chief Complaint  Patient presents with  . Edema    swollen forearms, right worse than left. Painful and itchy. Swelling caused when she is out in heat/sun.  . Constipation    Dr.Balan took her off Bydureon and has been having bowel issues since. Just doesn't feel well in general, has some hoarseness.    She presents with complaint of swelling in the right forearm.  She has had multiple episodes where it gets painful, swollen, and itchy--there was an overlying rash that is now resolving.  The left arm had swollen some too, but the right was worse.   This has happened at least 3-4 times, usually on the weekends when she is out in the sun/heat. She stays under a tent when at softball games to avoid the sun.  She tried different sunscreen but didn't make a difference. She thinks it is related to the heat. She is not having any direct sun. She denies getting stung by anything.   It lasts usually for 2-3 days, and then resolves.  She noticed swelling of the right upper forearm in the middle of the night on Saturday night.  Sunday it was still very swollen, red, and bumps on it.  This past time the left didn't have any involvement (in the past it did, but was mild).  She denies any change in activity--it is after her daughter has weekend ball and she is outside.  Not related to when she plays ball (on Tues and Thurs).  "I don't feel well".  Complaining of sore throat, some hoarseness for about a week.  Also having some constipation. Taking stool softener every other day for a while without benefit.  She has stopped the Bydureon, remains on metformin.  PMH, PSH, SH reviewed.  Outpatient Encounter Prescriptions as of 08/03/2015  Medication Sig Note  . cholecalciferol (VITAMIN D) 1000 UNITS tablet Take 2,000 Units by mouth daily.   . metFORMIN (GLUCOPHAGE) 500 MG tablet Take 1,000 mg by mouth 2 (two) times daily with a meal.    . ONE TOUCH ULTRA TEST test strip  02/02/2015: Received from: External  Pharmacy  . Probiotic Product (PROBIOTIC DAILY PO) Take 1 tablet by mouth daily.   . simvastatin (ZOCOR) 20 MG tablet Take 20 mg by mouth every evening.   . [DISCONTINUED] metFORMIN (GLUCOPHAGE-XR) 500 MG 24 hr tablet  08/03/2015: Received from: External Pharmacy   No facility-administered encounter medications on file as of 08/03/2015.   Allergies  Allergen Reactions  . Minocycline Rash   ROS:  No fever, chills, headache, dizziness, chest pain, palpitations, nausea, vomiting; some constipation, no blood in the stool. No urinary complaints. No arthralgias, numbness, tingling. +myalgia and swelling as per HPI. +sore throat, but no other URI symptoms. No cough, shortness of breath. See HPI  PHYSICAL EXAM: BP 108/64 mmHg  Pulse 80  Ht _0  (1.651 m)  Wt 160 lb (72.576 kg)  BMI 26.63 kg/m2  Well developed, tired-appearing female in no distress Some sniffling noted during visit HEENT: PERRL, EOMI, conjunctiva clear.  OP is clear.  Nasal mucosa is mod-severely edematous, no erythema, clear mucus.  Mildly tender over maxillary sinuses bilaterally Neck: no lymphadenopathy or mass Heart: regular rate and rhythm Lungs: clear bilaterally Abdomen: soft, nontender, no mass Extremities: Right forearm is mildly swollen, and slightly pink (but no rash/bumps) along the lateral, proximal forearm.  There is mild tenderness anterolaterally on the upper forarm.  She is tender over this area.  No bruising, cords induration.  Normal distal pulses and brisk capillary refill.  ASSESSMENT/PLAN:  Pain in joint, forearm, right - Plan: CBC with Differential/Platelet, ANA, CK, Sedimentation rate  Myalgia and myositis - Plan: CBC with Differential/Platelet, ANA, CK, Sedimentation rate  Sore throat  Stay well hydrated in this heat, and avoid the sun. If you have another episode, use an antihistamine (benadryl, claritin or zyrtec), and use hydrocortisone cream if there is rash and itching.  CBC, ESR, CPK,  ANA  URI vs allergies.  Trial of antihistamines and sinus rinses as needed. No evidence of bacterial infection.  Constipation--drink more fluids, high fiber diet. You can increase the stool softener to every day (and some people need to take 2 colace every day).

## 2015-08-04 LAB — ANA: ANA: NEGATIVE

## 2015-08-04 LAB — SEDIMENTATION RATE: SED RATE: 1 mm/h (ref 0–20)

## 2015-08-27 ENCOUNTER — Encounter: Payer: Self-pay | Admitting: Family Medicine

## 2015-08-27 ENCOUNTER — Ambulatory Visit (INDEPENDENT_AMBULATORY_CARE_PROVIDER_SITE_OTHER): Payer: BC Managed Care – PPO | Admitting: Family Medicine

## 2015-08-27 VITALS — BP 118/70 | HR 72 | Ht 65.0 in | Wt 157.0 lb

## 2015-08-27 DIAGNOSIS — M222X1 Patellofemoral disorders, right knee: Secondary | ICD-10-CM | POA: Diagnosis not present

## 2015-08-27 DIAGNOSIS — M62830 Muscle spasm of back: Secondary | ICD-10-CM

## 2015-08-27 DIAGNOSIS — Z23 Encounter for immunization: Secondary | ICD-10-CM

## 2015-08-27 MED ORDER — NAPROXEN 500 MG PO TABS: 500.0000 mg | ORAL_TABLET | Freq: Two times a day (BID) | ORAL | Status: DC

## 2015-08-27 MED ORDER — METAXALONE 800 MG PO TABS: 400.0000 mg | ORAL_TABLET | Freq: Three times a day (TID) | ORAL | Status: DC | PRN

## 2015-08-27 NOTE — Patient Instructions (Signed)
Take naproxen twice daily with food, regularly until your pain is resolved.  You likely will need it for at least a week, you may safely use it for the full 15 days, if needed. If it bothers your stomach, cut the dose down to 1/2 tablet (which is like the OTC Aleve strength).   Do not use any over-the-counter anti-inflammatories--no advil, motrin, aleve. You may use tylenol if needed for more pain relief.  Use the muscle relaxant only if needed for spasm of the muscle in your back.  It shouldn't cause too much sedation--use it cautiously at first (consider using just 1/2 tablet if taking it during the day until you know how it makes you feel).  For the back--do the stretches as shown.  Continue to use heat, massage, and work on your posture.  Wear comfortable and supportive shoes.   Patellofemoral Syndrome If you have had pain in the front of your knee for a long time, chances are good that you have patellofemoral syndrome. The word patella refers to the kneecap. Femoral (or femur) refers to the thigh bone. That is the bone the kneecap sits on. The kneecap is shaped like a triangle. Its job is to protect the knee and to improve the efficiency of your thigh muscles (quadriceps). The underside of the kneecap is made of smooth tissue (cartilage). This lets the kneecap slide up and down as the knee moves. Sometimes this cartilage becomes soft. Your healthcare provider may say the cartilage breaks down. That is patellofemoral syndrome. It can affect one knee, or both. The condition is sometimes called patellofemoral pain syndrome. That is because the condition is painful. The pain usually gets worse with activity. Sitting for a long time with the knee bent also makes the pain worse. It usually gets better with rest and proper treatment. CAUSES  No one is sure why some people develop this problem and others do not. Runners often get it. One name for the condition is "runner's knee." However, some people  run for years and never have knee pain. Certain things seem to make patellofemoral syndrome more likely. They include:  Moving out of alignment. The kneecap is supposed to move in a straight line when the thigh muscle pulls on it. Sometimes the kneecap moves in poor alignment. That can make the knee swell and hurt. Some experts believe it also wears down the cartilage.  Injury to the kneecap.  Strain on the knee. This may occur during sports activity. Soccer, running, skiing and cycling can put excess stress on the knee.  Being flat-footed or knock-kneed. SYMPTOMS   Knee pain.  Pain under the kneecap. This is usually a dull, aching pain.  Pain in the knee when doing certain things: squatting, kneeling, going up or down stairs.  Pain in the knee when you stand up after sitting down for awhile.  Tightness in the knee.  Loss of muscle strength in the thigh.  Swelling of the knee. DIAGNOSIS  Healthcare providers often send people with knee pain to an orthopedic caregiver. This person has special training to treat problems with bones and joints. To decide what is causing your knee pain, your caregiver will probably:  Do a physical exam. This will probably include:  Asking about symptoms you have noticed.  Asking about your activities and any injuries.  Feeling your knee. Moving it. This will help test the knee's strength. It will also check alignment (whether the knee and leg are aligned normally).  Order some tests,  such as:  Imaging tests. They create pictures of the inside of the knee. Tests may include:  X-rays.  Computed tomography (CT) scan. This uses X-rays and a computer to show more detail.  Magnetic resonance imaging (MRI). This test uses magnets, radio waves and a computer to make pictures. TREATMENT   Medication is almost always used first. It can relieve pain. It also can reduce swelling. Non-steroidal anti-inflammatory medicines (called NSAIDs) are usually  suggested. Sometimes a stronger form is needed. A stronger form would require a prescription.  Other treatment may be needed after the swelling goes down. Possibilities include:  Exercise. Certain exercises can make the muscles around the knee stronger which decreases the pressure on the knee cap. This includes the thigh muscle. Certain exercises also may be suggested to increase your flexibility.  A knee brace. This gives the knee extra support and helps align the movement of the knee cap.  Orthotics. These are special shoe inserts. They can help keep your leg and knee aligned.  Surgery is sometimes needed. This is rare. Options include:  Arthroscopy. The surgeon uses a special tool to remove any damaged pieces of the kneecap. Only a few small incisions (cuts) are needed.  Realignment. This is open surgery. The goals are to reduce pressure and fix the way the kneecap moves. HOME CARE INSTRUCTIONS   Take any medication prescribed by your healthcare provider. Follow the directions carefully.  If your knee is swollen:  Put ice or cold packs on it. Do this for 20 to 30 minutes, 3 to 4 times a day.  Keep the knee raised. Make sure it is supported. Put a pillow under it.  Rest your knee. For example, take the elevator instead of the stairs for awhile. Or, take a break from sports activity that strain your knee. Try walking or swimming instead.  Whenever you are active:  Use an elastic bandage on your knee. This gives it support.  After any activity, put ice or cold packs on your knees. Do this for about 10 to 20 minutes.  Make sure you wear shoes that give good support. Make sure they are not worn down. The heels should not slant in or out. SEEK MEDICAL CARE IF:   Knee pain gets worse. Or it does not go away, even after taking pain medicine.  Swelling does not go down.  Your thigh muscle becomes weak.  You have an oral temperature above 102 F (38.9 C). SEEK IMMEDIATE MEDICAL  CARE IF:  You have an oral temperature above 102 F (38.9 C), not controlled by medicine. Document Released: 11/23/2009 Document Revised: 02/27/2012 Document Reviewed: 02/24/2014 [facility] Patient Information 2015 [place], [place]. This information is not intended to replace advice given to you by your health care provider. Make sure you discuss any questions you have with your health care provider.

## 2015-08-27 NOTE — Progress Notes (Signed)
Chief Complaint  Patient presents with  . Back Pain    left sided mid back pain x 3 weeks to a month.  . Knee Pain    right knee pain x 3 weeks to a month.    She presents with complaint of crampy pain just inside her left shoulder blade, described as a constant ache over the last 3 weeks.  It bothers her more at night/end of a long day.  No pain with playing softball. It feels muscular, and she had her husband massage the area--not sure if it helped.  She used a foam roller--?if it helped.  Tried advil once, didn't notice any benefit.  Heat helps temporarily.  Denies neck pain or radiating pain.  Right knee hurts with walking for about a month.  No known injury.  She plays softball twice a week--doesn't affect her playing, doesn't hurt worse after, but hurts anytime she walks/runs.  Pain is on the lateral side of the kneecap. Hasn't noticed any swelling, redness, clicking/popping.  Denies any locking or giving way.  PMH,PSH, SH reviewed  Outpatient Encounter Prescriptions as of 08/27/2015  Medication Sig Note  . cholecalciferol (VITAMIN D) 1000 UNITS tablet Take 2,000 Units by mouth daily.   . metFORMIN (GLUCOPHAGE) 500 MG tablet Take 1,000 mg by mouth 2 (two) times daily with a meal.    . ONE TOUCH ULTRA TEST test strip  02/02/2015: Received from: External Pharmacy  . Probiotic Product (PROBIOTIC DAILY PO) Take 1 tablet by mouth daily.   . simvastatin (ZOCOR) 20 MG tablet Take 20 mg by mouth every evening.    No facility-administered encounter medications on file as of 08/27/2015.   Allergies  Allergen Reactions  . Minocycline Rash   ROS: no fever, chills, urinary complaints, bleeding, bruising, GI complaints, URI symptoms, chest pain or other concerns, except as noted in HPI  PHYSICAL EXAM: BP 118/70 mmHg  Pulse 72  Ht  (1.651 m)  Wt 157 lb (71.215 kg)  BMI 26.13 kg/m2  Pleasant female, in no distress. Very mild discomfort related to her back pain  Right knee--pain with  patellar compression.  Pain is at lateral kneecap. No effusion, warmth, erythema. Negative McMurray, drawer, no pain with valgus/varus stress, no joint line tenderness. Back: no CVA tenderness or spinal tenderness. Tender at right lower rhomboid, as well as paraspinous muscles on left (just at, slightly above and below her bra line). Normal strength, sensation, gait  ASSESSMENT/PLAN:  Patellofemoral disorder of right knee - Plan: naproxen (NAPROSYN) 500 MG tablet, metaxalone (SKELAXIN) 800 MG tablet  Need for prophylactic vaccination and inoculation against influenza  Muscle spasm of back - Plan: metaxalone (SKELAXIN) 800 MG tablet   Take naproxen twice daily with food, regularly until your pain is resolved.  You likely will need it for at least a week, you may safely use it for the full 15 days, if needed.  Do not use any over-the-counter anti-inflammatories--no advil, motrin, aleve. You may use tylenol if needed for more pain relief.  Recommended knee compression sleeve and exercises--see handout given.  For the back--do the stretches as shown.  Continue to use heat, massage, and work on your posture.  Wear comfortable and supportive shoes.   Muscle relaxants prn Risks/side effects of both medications were reviewed in detail.   F/u if pain persists/worsens.

## 2015-11-19 ENCOUNTER — Other Ambulatory Visit: Payer: Self-pay

## 2015-11-19 DIAGNOSIS — Z1231 Encounter for screening mammogram for malignant neoplasm of breast: Secondary | ICD-10-CM

## 2016-02-05 ENCOUNTER — Ambulatory Visit
Admission: RE | Admit: 2016-02-05 | Discharge: 2016-02-05 | Disposition: A | Discharge status: Home / Self Care | Payer: BC Managed Care – PPO | Source: Ambulatory Visit

## 2016-02-05 DIAGNOSIS — Z1231 Encounter for screening mammogram for malignant neoplasm of breast: Secondary | ICD-10-CM

## 2016-02-09 ENCOUNTER — Other Ambulatory Visit: Payer: Self-pay | Admitting: Family Medicine

## 2016-02-09 DIAGNOSIS — R928 Other abnormal and inconclusive findings on diagnostic imaging of breast: Secondary | ICD-10-CM

## 2016-02-15 ENCOUNTER — Ambulatory Visit
Admission: RE | Admit: 2016-02-15 | Discharge: 2016-02-15 | Disposition: A | Discharge status: Home / Self Care | Payer: BC Managed Care – PPO | Source: Ambulatory Visit | Attending: Family Medicine | Admitting: Family Medicine

## 2016-02-15 DIAGNOSIS — R928 Other abnormal and inconclusive findings on diagnostic imaging of breast: Secondary | ICD-10-CM

## 2016-02-24 ENCOUNTER — Encounter: Payer: Self-pay | Admitting: Gastroenterology

## 2016-02-24 ENCOUNTER — Encounter: Payer: Self-pay | Admitting: Family Medicine

## 2016-02-24 ENCOUNTER — Ambulatory Visit (INDEPENDENT_AMBULATORY_CARE_PROVIDER_SITE_OTHER): Payer: BC Managed Care – PPO | Admitting: Family Medicine

## 2016-02-24 VITALS — BP 110/68 | HR 68 | Ht 65.0 in | Wt 155.0 lb

## 2016-02-24 DIAGNOSIS — R1310 Dysphagia, unspecified: Secondary | ICD-10-CM | POA: Diagnosis not present

## 2016-02-24 DIAGNOSIS — J309 Allergic rhinitis, unspecified: Secondary | ICD-10-CM | POA: Diagnosis not present

## 2016-02-24 DIAGNOSIS — M940 Chondrocostal junction syndrome [Tietze]: Secondary | ICD-10-CM

## 2016-02-24 MED ORDER — OMEPRAZOLE 40 MG PO CPDR: 40.0000 mg | DELAYED_RELEASE_CAPSULE | Freq: Every day | ORAL | Status: DC

## 2016-02-24 NOTE — Progress Notes (Signed)
Chief Complaint  Patient presents with  . Advice Only    when she swallows while eating feels like things are getting stuck for a while now. The other day it happened and she could't get it to go down and she got scared.    Intermittent problems with dysphagia x 6 months, but the episode the other day really scared her.  She was eating steak fries, and she felt like it got stuck in her mid chest area.  She needed to pat her chest to make it go down. Didn't feel like drinking water helped, just the patting.  It was painful, and she thought she might throw the food back up.  Once it went down, pain resolved, and no problems since.  This occurred last week. She has had intermittent symptoms x 6 mos, but with increasing frequency (and severity with this last episode).  She denies any heartburn, burning, belching. 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  She does have runny nose in the mornings, and some drainage.  Denies any current flare of allergies, no change with the change in weather (tree pollen is flaring now).   PMH, PSH, SH reviewed  Outpatient Encounter Prescriptions as of 02/24/2016  Medication Sig Note  . JANUMET XR 50-1000 MG TB24 Take 1 tablet by mouth 2 (two) times daily with a meal. 02/24/2016: Received from: External Pharmacy  . ONE TOUCH ULTRA TEST test strip  02/02/2015: Received from: External Pharmacy  . simvastatin (ZOCOR) 20 MG tablet Take 20 mg by mouth every evening.   . cholecalciferol (VITAMIN D) 1000 UNITS tablet Take 2,000 Units by mouth daily. Reported on 02/24/2016   . Probiotic Product (PROBIOTIC DAILY PO) Take 1 tablet by mouth daily. Reported on 02/24/2016   . [DISCONTINUED] metaxalone (SKELAXIN) 800 MG tablet Take 0.5-1 tablets (400-800 mg total) by mouth 3 (three) times daily as needed for muscle spasms.   . [DISCONTINUED] metFORMIN (GLUCOPHAGE) 500 MG tablet Take 1,000 mg by mouth 2 (two) times  daily with a meal.    . [DISCONTINUED] naproxen (NAPROSYN) 500 MG tablet Take 1 tablet (500 mg total) by mouth 2 (two) times daily with a meal.    No facility-administered encounter medications on file as of 02/24/2016.   Allergies  Allergen Reactions  . Minocycline Rash   ROS: no fever, chills, URI symptoms (just mild allergies), cough, shortness of breath, nausea, abdominal pain, bowel changes, bleeding, bruising, rash. +chest wall pain--some ongoing sore areas at the right side, where the ribs join the sternum. She did new exercise at her job recently.  PHYSICAL EXAM: BP 110/68 mmHg  Pulse 68  Ht 5\' 5"  (1.651 m)  Wt 155 lb (70.308 kg)  BMI 25.79 kg/m2  Well developed, pleasant female in no distress HEENT: PERRL, EOMI, conjunctiva and sclera are clear. Nasal mucosa is mod-severely edematous, clear mucus. Sinuses nontender OP is clear, no cobblestoning. Neck: no lymphadenopathy, thyromegaly or mass Heart: regular rate and rhythm Lungs: clear bilaterally Abdomen: soft, nontender, no organomegaly or mass Chest wall: tender at upper right costochondral junction (tender at just one level). Extremities: no edema Neuro: alert and oriented, cranial nerves intact, normal gait, strength Psych: normal mood, affect, hygiene and grooming  ASSESSMENT/PLAN:  Dysphagia - Plan: omeprazole (PRILOSEC) 40 MG capsule, Ambulatory referral to Gastroenterology  Allergic rhinitis, unspecified allergic rhinitis type  Costochondritis  Discussed diagnostic options including Barium swallow vs EGD  Refer to GI, wait 2-3 weeks for appt to give PPI a chance to see if symptoms resolve. DDX of dysphagia reviewed with pt (reflux, mass, stricture, web, esophageal dysmotility, esophagitis).  Trial of Omeprazole  x 1 month. If symptoms resolve, can cancel GI appointment, continue PPI x 3 months, and let us know if symptoms recur after stopping  Hoarseness--likely related to allergies, possible reflux  contribution. Start claritin daily, given her current physical exam.  Costochondritis:  Reviewed supportive measures, likely flared with recent change in activity/exercise.  Ice/heat/NSAID/rest.  F/u by phone/MyChart in the next 2-4 weeks

## 2016-02-24 NOTE — Patient Instructions (Addendum)
Take the omeprazole once daily in the morning, prior to breakfast. Start taking claritin or allegra or zyrtec (store brands) once daily, as you have evidence of allergies, which is likely contributing to your hoarseness when singing.  Make sure to drink fluids when eating, and chew your food well prior to swallowing.  We are referring you to [facility] for evaluation--if your symptoms completely reslve with treatment for reflux/esophageal inflammation, then you can cancel the appointment. I would treat for 3 months if symptoms resolve quickly. Be in touch and let us know how you're doing.  Dysphagia Swallowing problems (dysphagia) occur when solids and liquids seem to stick in your throat on the way down to your stomach, or the food takes longer to get to the stomach. Other symptoms include regurgitating food, noises coming from the throat, chest discomfort with swallowing, and a feeling of fullness or the feeling of something being stuck in your throat when swallowing. When blockage in your throat is complete, it may be associated with drooling. CAUSES  Problems with swallowing may occur because of problems with the muscles. The food cannot be propelled in the usual manner into your stomach. You may have ulcers, scar tissue, or inflammation in the tube down which food travels from your mouth to your stomach (esophagus), which blocks food from passing normally into the stomach. Causes of inflammation include:  Acid reflux from your stomach into your esophagus.  Infection.  Radiation treatment for cancer.  Medicines taken without enough fluids to wash them down into your stomach. You may have nerve problems that prevent signals from being sent to the muscles of your esophagus to contract and move your food down to your stomach. Globus pharyngeus is a relatively common problem in which there is a sense of an obstruction or difficulty in swallowing, without any physical abnormalities of the  swallowing passages being found. This problem usually improves over time with reassurance and testing to rule out other causes. DIAGNOSIS Dysphagia can be diagnosed and its cause can be determined by tests in which you swallow a white substance that helps illuminate the inside of your throat (contrast medium) while X-rays are taken. Sometimes a flexible telescope that is inserted down your throat (endoscopy) to look at your esophagus and stomach is used. TREATMENT   If the dysphagia is caused by acid reflux or infection, medicines may be used.  If the dysphagia is caused by problems with your swallowing muscles, swallowing therapy may be used to help you strengthen your swallowing muscles.  If the dysphagia is caused by a blockage or mass, procedures to remove the blockage may be done. HOME CARE INSTRUCTIONS  Try to eat soft food that is easier to swallow and check your weight on a daily basis to be sure that it is not decreasing.  Be sure to drink liquids when sitting upright (not lying down). SEEK MEDICAL CARE IF:  You are losing weight because you are unable to swallow.  You are coughing when you drink liquids (aspiration).  You are coughing up partially digested food. SEEK IMMEDIATE MEDICAL CARE IF:  You are unable to swallow your own saliva .  You are having shortness of breath or a fever, or both.  You have a hoarse voice along with difficulty swallowing. MAKE SURE YOU:  Understand these instructions.  Will watch your condition. Will get help right away if you are not doing well or get worse.  Costochondritis Costochondritis, sometimes called Tietze syndrome, is a swelling and irritation (  inflammation) of the tissue (cartilage) that connects your ribs with your breastbone (sternum). It causes pain in the chest and rib area. Costochondritis usually goes away on its own over time. It can take up to 6 weeks or longer to get better, especially if you are unable to limit your  activities. CAUSES  Some cases of costochondritis have no known cause. Possible causes include:  Injury (trauma).  Exercise or activity such as lifting.  Severe coughing. SIGNS AND SYMPTOMS  Pain and tenderness in the chest and rib area.  Pain that gets worse when coughing or taking deep breaths.  Pain that gets worse with specific movements. DIAGNOSIS  Your health care provider will do a physical exam and ask about your symptoms. Chest X-rays or other tests may be done to rule out other problems. TREATMENT  Costochondritis usually goes away on its own over time. Your health care provider may prescribe medicine to help relieve pain. HOME CARE INSTRUCTIONS   Avoid exhausting physical activity. Try not to strain your ribs during normal activity. This would include any activities using chest, abdominal, and side muscles, especially if heavy weights are used.  Apply ice to the affected area for the first 2 days after the pain begins.  Put ice in a plastic bag.  Place a towel between your skin and the bag.  Leave the ice on for 20 minutes, 2-3 times a day.  Only take over-the-counter or prescription medicines as directed by your health care provider. SEEK MEDICAL CARE IF:  You have redness or swelling at the rib joints. These are signs of infection.  Your pain does not go away despite rest or medicine. SEEK IMMEDIATE MEDICAL CARE IF:   Your pain increases or you are very uncomfortable.  You have shortness of breath or difficulty breathing.  You cough up blood.  You have worse chest pains, sweating, or vomiting.  You have a fever or persistent symptoms for more than 2-3 days.  You have a fever and your symptoms suddenly get worse. MAKE SURE YOU:   Understand these instructions.  Will watch your condition.  Will get help right away if you are not doing well or get worse.   This information is not intended to replace advice given to you by your health care provider.  Make sure you discuss any questions you have with your health care provider.   Document Released: 09/14/2005 Document Revised: 09/25/2013 Document Reviewed: 07/09/2013 [employer] Interactive Patient Education [employer]

## 2016-03-16 IMAGING — RF DG FLUORO GUIDE NDL PLC/BX
7 series · 7 of 7 positions shown · non-contrast
Comparison: none

CLINICAL DATA: Pain and limited range of motion.

[Series 1: (hospital) · 1 of 1 slices shown (1 of 7)]
[im 1/1]
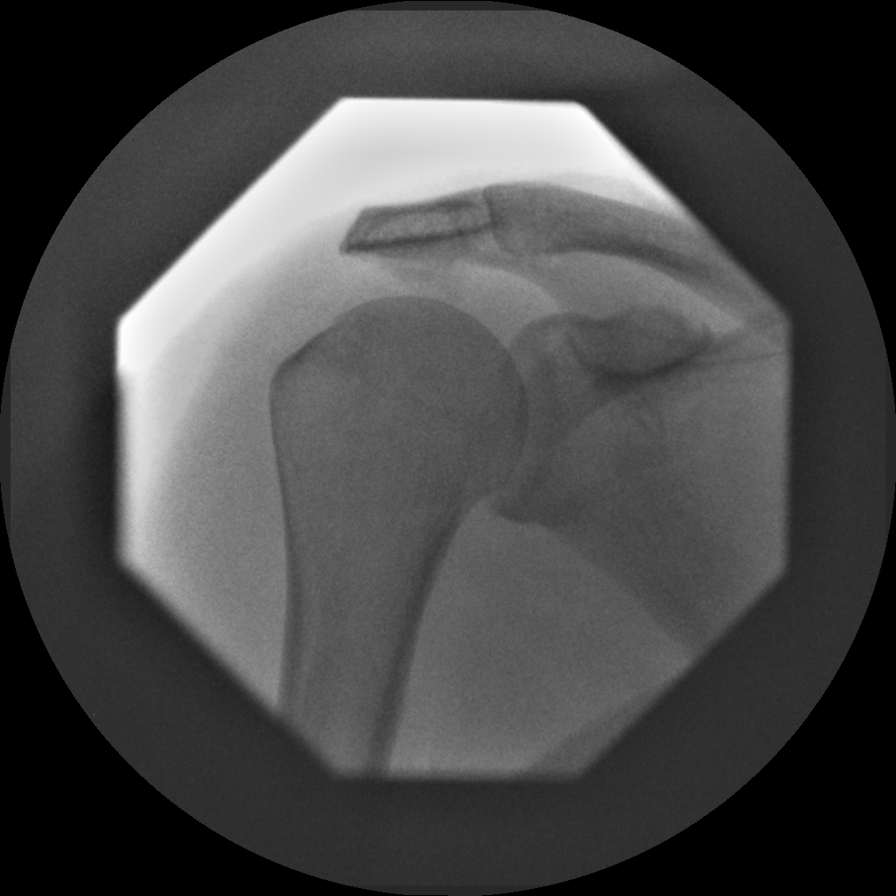

[Series 2: (hospital) · 1 of 1 slices shown (2 of 7)]
[im 1/1]
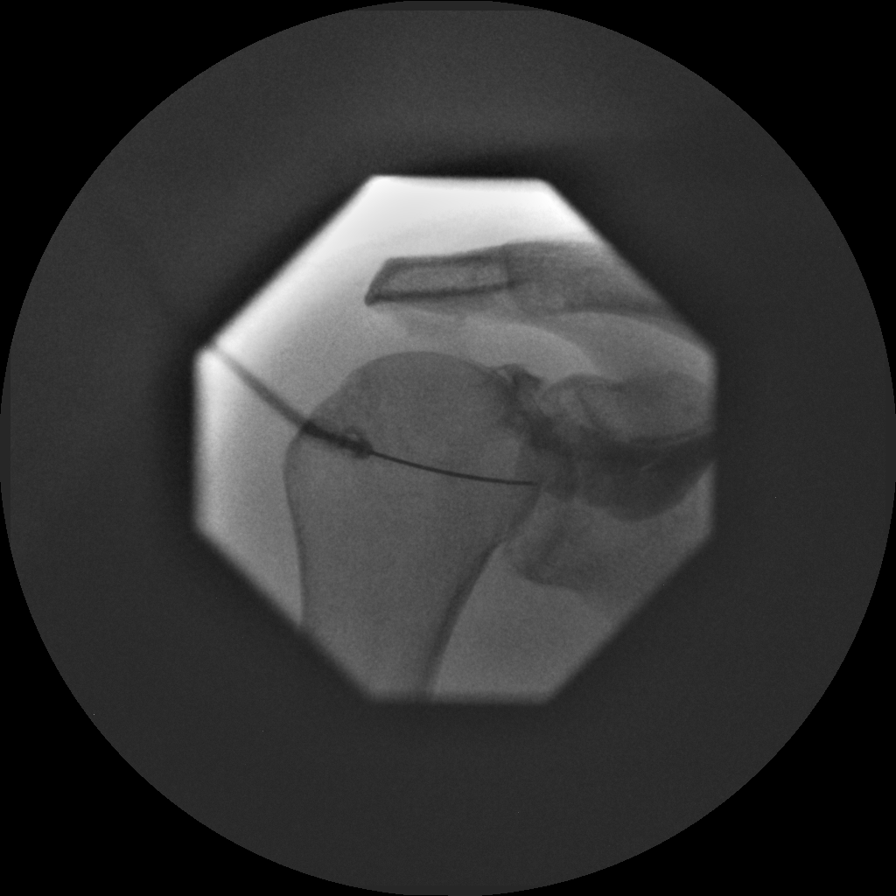

[Series 3: (hospital) · 1 of 1 slices shown (3 of 7)]
[im 1/1]
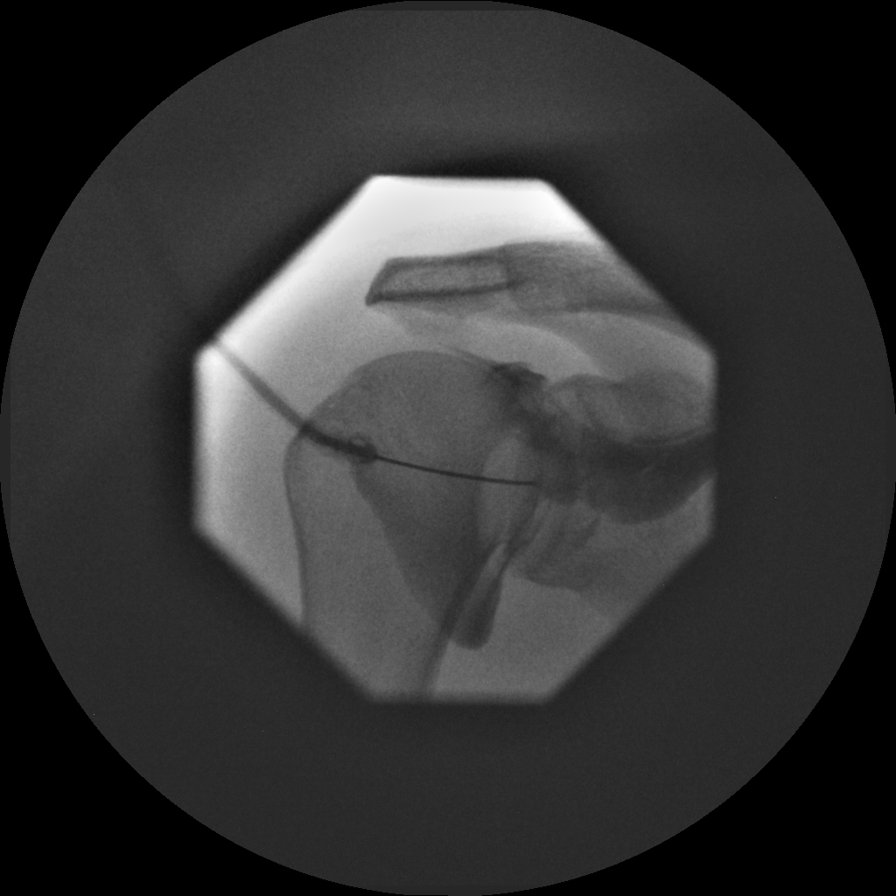

[Series 4: (hospital) · 1 of 1 slices shown (4 of 7)]
[im 1/1]
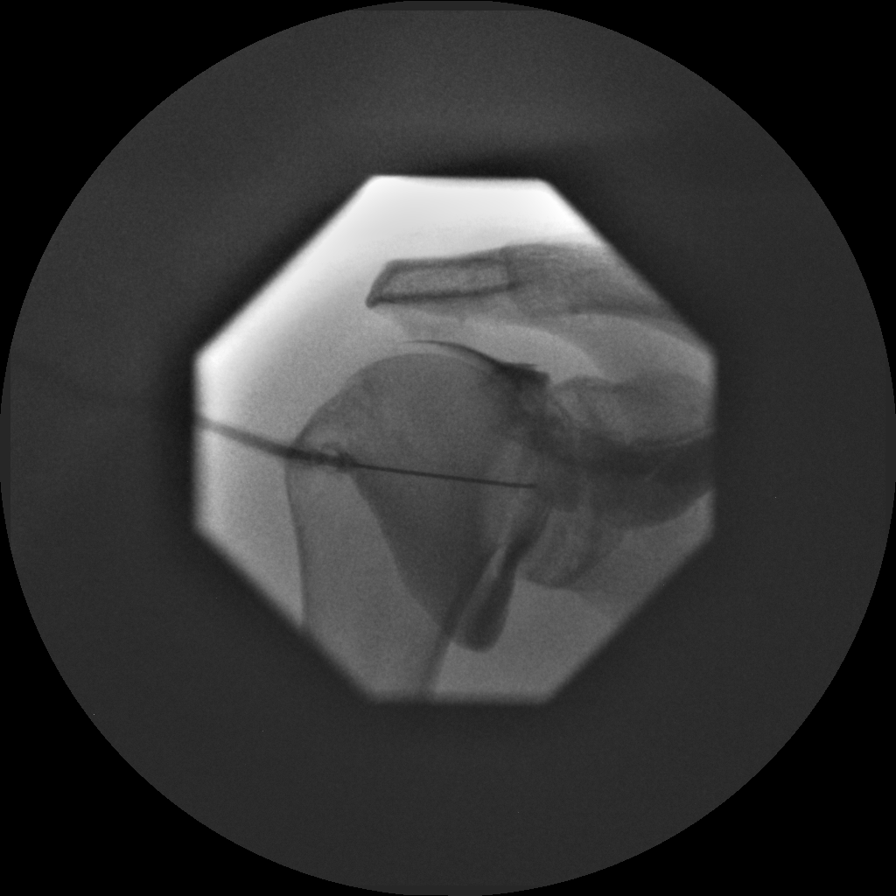

[Series 5: (hospital) · 1 of 1 slices shown (5 of 7)]
[im 1/1]
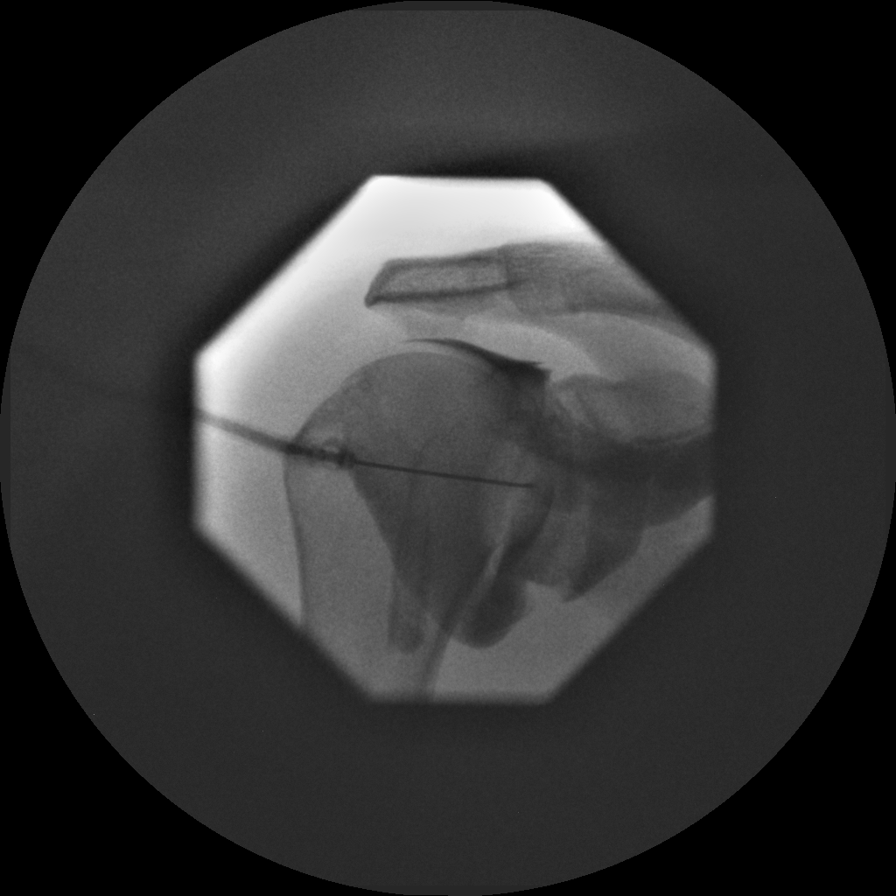

[Series 6: (hospital) · 1 of 1 slices shown (6 of 7)]
[im 1/1]
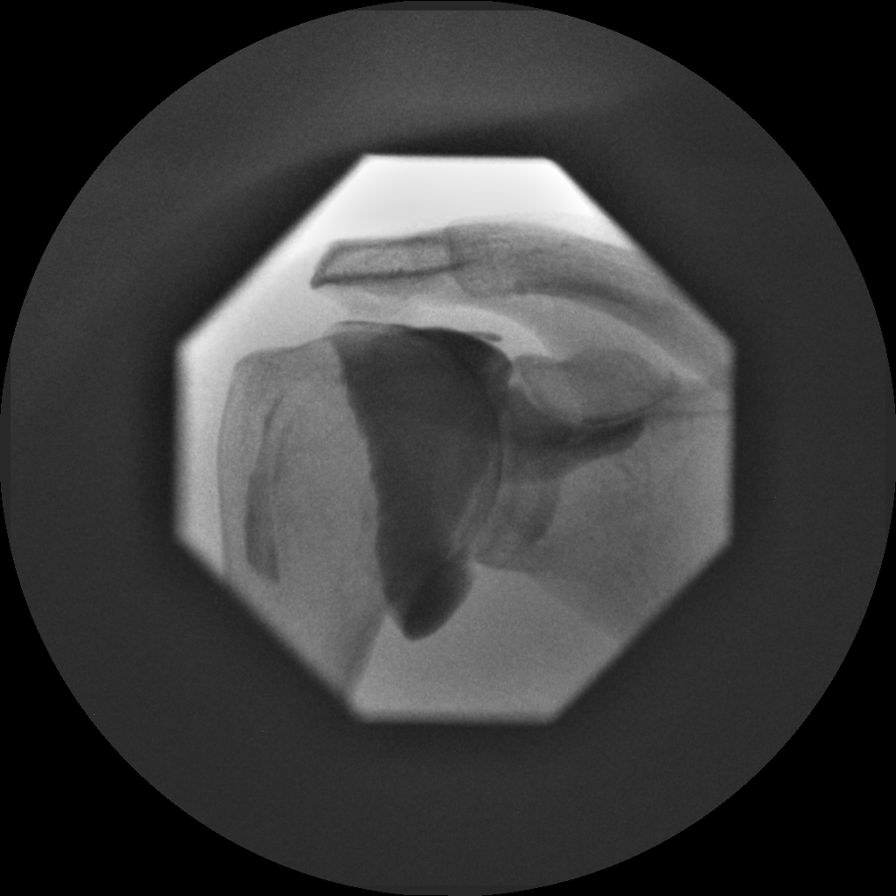

[Series 7: (hospital) · 1 of 1 slices shown (7 of 7)]
[im 1/1]
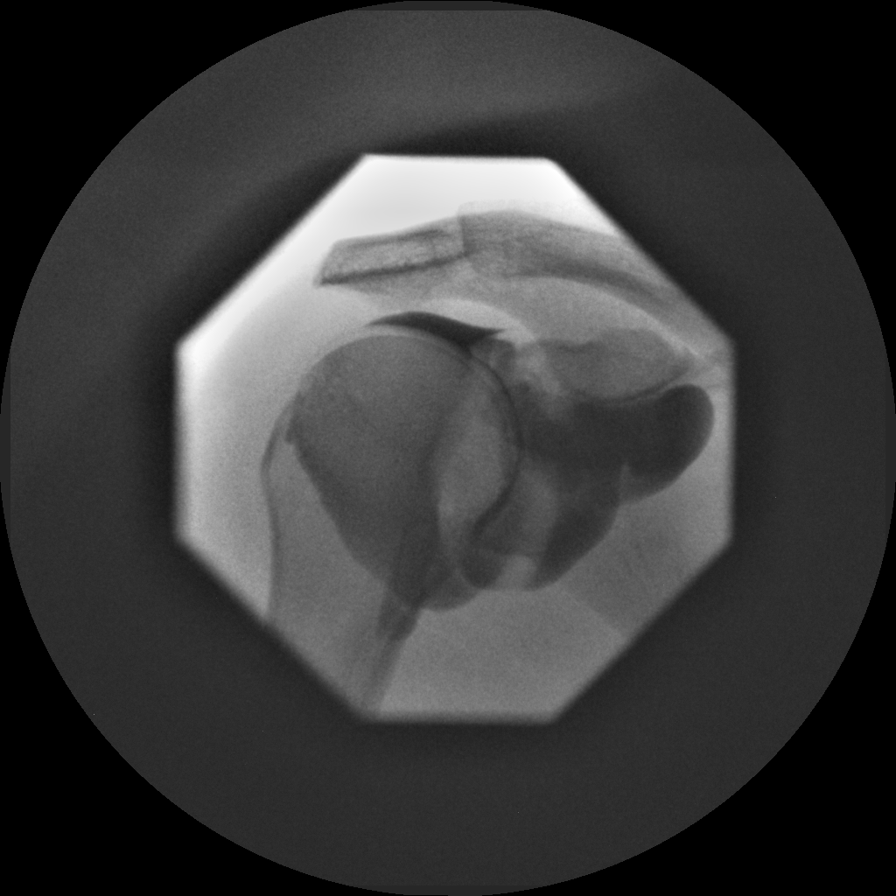

[7 of 7 positions shown; findings below may reference images not displayed]

FLUOROSCOPY TIME:  0 minutes 44 seconds.  Seven images.

PROCEDURE:
Right SHOULDER INJECTION UNDER FLUOROSCOPY

An appropriate skin entrance site was determined. The site was
marked, prepped with Betadine, draped in the usual sterile fashion,
and infiltrated locally with Lidocaine. 22 gauge spinal needle was
advanced to the margin of the humeral head under intermittent
fluoroscopy. A mixture of 0.1 ml Multihance and 20 ml of dilute
Omnipaque 180 was then used to fill the right shoulder joint. No
immediate complication. Limited filming does not show any
abnormality.
IMPRESSION: Technically successful right shoulder injection for MRI.

## 2016-04-05 ENCOUNTER — Telehealth: Payer: Self-pay | Admitting: Family Medicine

## 2016-04-05 DIAGNOSIS — R1310 Dysphagia, unspecified: Secondary | ICD-10-CM

## 2016-04-05 NOTE — Telephone Encounter (Signed)
Rcvd request from pharmacy to change Omeprazole 40 mg script to a 90 day script

## 2016-04-06 ENCOUNTER — Encounter: Payer: Self-pay | Admitting: Gastroenterology

## 2016-04-06 ENCOUNTER — Ambulatory Visit (INDEPENDENT_AMBULATORY_CARE_PROVIDER_SITE_OTHER): Payer: BC Managed Care – PPO | Admitting: Gastroenterology

## 2016-04-06 VITALS — BP 100/74 | Ht 64.0 in | Wt 156.5 lb

## 2016-04-06 DIAGNOSIS — K219 Gastro-esophageal reflux disease without esophagitis: Secondary | ICD-10-CM

## 2016-04-06 DIAGNOSIS — R1310 Dysphagia, unspecified: Secondary | ICD-10-CM

## 2016-04-06 MED ORDER — OMEPRAZOLE 40 MG PO CPDR: 40.0000 mg | DELAYED_RELEASE_CAPSULE | Freq: Every day | ORAL | Status: DC

## 2016-04-06 NOTE — Patient Instructions (Signed)

## 2016-04-06 NOTE — Progress Notes (Signed)
Vanessa [name]    [id number]    03/22/1966  Primary Care Physician:KNAPP,EVE A, MD  Referring Physician: Joselyn ArrowEve Knapp, MD [address] [place] [id number]  Chief complaint: Dysphagia  HPI: 50 year old female with history of diabetes here with complaints of intermittent solid dysphagia for past 4-6 months. No history of food impactions. She was started by her PMD on omeprazole 40 mg daily a month ago, hasn't noticed any difference. She denies any heartburn or odynophagia. She did notice having worsening sore throat and hoarseness of voice for past few months. No history of weight gain or loss. She had a colonoscopy in 2011 by Dr. Loreta AveMann, report unavailable during this office visit. Denies any nausea, vomiting, abdominal pain, melena or bright red blood per rectum    Outpatient Encounter Prescriptions as of 04/06/2016  Medication Sig  . cholecalciferol (VITAMIN D) 1000 UNITS tablet Take 2,000 Units by mouth daily. Reported on 02/24/2016  . JANUMET XR 50-1000 MG TB24 Take 1 tablet by mouth 2 (two) times daily with a meal.  . omeprazole (PRILOSEC) 40 MG capsule Take 1 capsule (40 mg total) by mouth daily.  . ONE TOUCH ULTRA TEST test strip   . Probiotic Product (PROBIOTIC DAILY PO) Take 1 tablet by mouth daily. Reported on 02/24/2016  . simvastatin (ZOCOR) 20 MG tablet Take 20 mg by mouth every evening.   No facility-administered encounter medications on file as of 04/06/2016.    Allergies as of 04/06/2016 - Review Complete 04/06/2016  Allergen Reaction Noted  . Minocycline Rash 10/10/2011    Past Medical History  Diagnosis Date  . Diabetes mellitus   . Hyperlipidemia   . Carpal tunnel syndrome, bilateral     h/o  . Acne 07/2009    s/p Accutane (derm in [place])  . Trochanteric bursitis 10/09    right  . Anemia 11/09    iron deficient--resolved after ablation  . Vitamin D deficiency   . Fibrocystic breast 11/2009    biopsy R breast   . H/O menorrhagia      Past Surgical History  Procedure Laterality Date  . Cesarean section  2002, 2005    x 2  . Endometrial ablation  09/2010    Dr. Pennie RushingHaygood  . Tubal ligation  2005  . Carpal tunnel release  R 12/08, L 2003    bilateral, Dr. Amanda PeaGramig  . Appendectomy  1988    Family History  Problem Relation Age of Onset  . Heart disease Father     MI in 5540's  . Diabetes Father   . Diabetes Maternal Grandmother   . Diabetes Maternal Grandfather   . Prostate cancer Paternal Grandfather     Social History   Social History  . Marital Status: Married    Spouse Name: N/A  . Number of Children: 2  . Years of Education: N/A   Occupational History  . 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 Smoking status: Never Smoker   . Smokeless tobacco: Never Used  . Alcohol Use: Yes     Comment: maybe one drink per year.  . Drug Use: No  . Sexual Activity:    Partners: Male    Birth Control/ Protection: Surgical   Other Topics Concern  . Not on file   Social History Narrative   Lives with husband, son, daughter  Review of systems: Review of Systems  Constitutional: Negative for fever and chills.  HENT: Negative.   Eyes: Negative for blurred vision.  Respiratory: Negative for cough, shortness of breath and wheezing.   Cardiovascular: Negative for chest pain and palpitations.  Gastrointestinal: as per HPI Genitourinary: Negative for dysuria, urgency, frequency and hematuria.  Musculoskeletal: Negative for myalgias, back pain and joint pain.  Skin: Negative for itching and rash.  Neurological: Negative for dizziness, tremors, focal weakness, seizures and loss of consciousness.  Endo/Heme/Allergies: Negative for environmental allergies.  Psychiatric/Behavioral: Negative for depression, suicidal ideas and hallucinations.  All other systems reviewed and are negative.   Physical Exam: There were no vitals  filed for this visit. Gen:      No acute distress HEENT:  EOMI, sclera anicteric Neck:     No masses; no thyromegaly Lungs:    Clear to auscultation bilaterally; normal respiratory effort CV:         Regular rate and rhythm; no murmurs Abd:      + bowel sounds; soft, non-tender; no palpable masses, no distension Ext:    No edema; adequate peripheral perfusion Skin:      Warm and dry; no rash Neuro: alert and oriented x 3 Psych: normal mood and affect  Data Reviewed: Reviewed chart in epic   Assessment and Plan/Recommendations: 50 year old female with history of diabetes here with complaints of intermittent solid food dysphagia for past 4-6 months  We'll schedule for EGD for evaluation and based on findings will consider if further work up is needed to evalaute for possible esophageal dysmotility Continue omeprazole 40 mg daily, 30 minutes before breakfast Follow antireflux measures We'll obtain records from Dr. Kenna Gilbert office regarding her past GI workup  K. Scherry Ran , MD [phone] Mon-Fri 8a-5p [phone] after 5p, weekends, holidays

## 2016-04-06 NOTE — Telephone Encounter (Signed)
Done

## 2016-04-07 ENCOUNTER — Encounter: Payer: Self-pay | Admitting: Gastroenterology

## 2016-04-07 ENCOUNTER — Ambulatory Visit (AMBULATORY_SURGERY_CENTER): Payer: BC Managed Care – PPO | Admitting: Gastroenterology

## 2016-04-07 VITALS — BP 106/73 | HR 71 | Temp 97.7°F | Resp 12 | Ht 64.0 in | Wt 156.0 lb

## 2016-04-07 DIAGNOSIS — K222 Esophageal obstruction: Secondary | ICD-10-CM

## 2016-04-07 DIAGNOSIS — R1310 Dysphagia, unspecified: Secondary | ICD-10-CM

## 2016-04-07 LAB — GLUCOSE, CAPILLARY
GLUCOSE-CAPILLARY: 106 mg/dL — AB (ref 65–99)
Glucose-Capillary: 93 mg/dL (ref 65–99)

## 2016-04-07 MED ORDER — SODIUM CHLORIDE 0.9 % IV SOLN: 500.0000 mL | INTRAVENOUS | Status: DC

## 2016-04-07 NOTE — Op Note (Signed)
[facility] Patient Name: Vanessa [name] Procedure Date: 04/07/2016 10:07 AM MRN: [id number] Endoscopist: Vanessa [name] , MD Age: [age] Date of Birth: 01-Jul-1966 Gender: Female Procedure:                Upper GI endoscopy Indications:              Dysphagia Medicines:                Monitored Anesthesia Care Procedure:                Pre-Anesthesia Assessment:                           - Prior to the procedure, a History and Physical                            was performed, and patient medications and                            allergies were reviewed. The patient's tolerance of                            previous anesthesia was also reviewed. The risks                            and benefits of the procedure and the sedation                            options and risks were discussed with the patient.                            All questions were answered, and informed consent                            was obtained. Prior Anticoagulants: The patient has                            taken no previous anticoagulant or antiplatelet                            agents. ASA Grade Assessment: II - A patient with                            mild systemic disease. After reviewing the risks                            and benefits, the patient was deemed in                            satisfactory condition to undergo the procedure.                           After obtaining informed consent, the endoscope was  passed under direct vision. Throughout the                            procedure, the patient's blood pressure, pulse, and                            oxygen saturations were monitored continuously. The                            Model GIF-[id number] scope was introduced                            through the mouth, and advanced to the second part                            of duodenum. The upper GI endoscopy was   accomplished without difficulty. The patient                            tolerated the procedure well. Scope In: Scope Out: Findings:                 One moderate benign-appearing, intrinsic stenosis                            was found 37 to 38 cm from the incisors. This                            measured 1 cm (inner diameter) x less than one cm                            (in length) and was traversed. A TTS dilator was                            passed through the scope. Dilation with an 10-30-12                            mm balloon dilator was performed to 13 mm. The                            dilation site was examined following endoscope                            reinsertion and showed no change. Estimated blood                            loss: none. Subsequently a guidewire was placed and                            the scope was withdrawn. Dilation was performed in                            the lower third of the esophagus with a Savary  dilator starting at 12 mm- 68mm-14mm and to maximal                            diameter of 15mm with mild resistance at 15 mm.                           Multiple biopsies were obtained with cold forceps                            for evaluation of eosinophilic esophagitis randomly                            in the mid esophagus and in the distal esophagus.                           The stomach was normal.                           The examined duodenum was normal. Complications:            No immediate complications. Estimated Blood Loss:     Estimated blood loss was minimal. Impression:               - Benign-appearing esophageal stenosis. Dilated.                           Multiple biopsies were obtained in the mid                            esophagus and in the distal esophagus.                           - Normal stomach.                           - Normal examined duodenum. Recommendation:           - Patient has a  contact number available for                            emergencies. The signs and symptoms of potential                            delayed complications were discussed with the                            patient. Return to normal activities tomorrow.                            Written discharge instructions were provided to the                            patient.                           - Resume previous diet.                           -  Continue present medications.                           - Await pathology results.                           - Repeat upper endoscopy (date not yet determined)                            for surveillance based on pathology results. Vanessa Form, MD 04/07/2016 10:44:56 AM This report has been signed electronically.

## 2016-04-07 NOTE — Progress Notes (Signed)
Called to room to assist during endoscopic procedure.  Patient ID and intended procedure confirmed with present staff. Received instructions for my participation in the procedure from the performing physician.  

## 2016-04-07 NOTE — Patient Instructions (Signed)
Impression/recommendations:  Benign appearing stenosis (stricture) handout given  Repeat endoscopy pending pathology results.  YOU HAD AN ENDOSCOPIC PROCEDURE TODAY AT THE [facility]:   Refer to the procedure report that was given to you for any specific questions about what was found during the examination.  If the procedure report does not answer your questions, please call your gastroenterologist to clarify.  If you requested that your care partner not be given the details of your procedure findings, then the procedure report has been included in a sealed envelope for you to review at your convenience later.  YOU SHOULD EXPECT: Some feelings of bloating in the abdomen. Passage of more gas than usual.  Walking can help get rid of the air that was put into your GI tract during the procedure and reduce the bloating. If you had a lower endoscopy (such as a colonoscopy or flexible sigmoidoscopy) you may notice spotting of blood in your stool or on the toilet paper. If you underwent a bowel prep for your procedure, you may not have a normal bowel movement for a few days.  Please Note:  You might notice some irritation and congestion in your nose or some drainage.  This is from the oxygen used during your procedure.  There is no need for concern and it should clear up in a day or so.  SYMPTOMS TO REPORT IMMEDIATELY:   Following upper endoscopy (EGD)  Vomiting of blood or coffee ground material  New chest pain or pain under the shoulder blades  Painful or persistently difficult swallowing  New shortness of breath  Fever of 100F or higher  Black, tarry-looking stools  For urgent or emergent issues, a gastroenterologist can be reached at any hour by calling [phone].   DIET: Your first meal following the procedure should be a small meal and then it is ok to progress to your normal diet. Heavy or fried foods are harder to digest and may make you feel nauseous or bloated.   Likewise, meals heavy in dairy and vegetables can increase bloating.  Drink plenty of fluids but you should avoid alcoholic beverages for 24 hours.  ACTIVITY:  You should plan to take it easy for the rest of today and you should NOT DRIVE or use heavy machinery until tomorrow (because of the sedation medicines used during the test).    FOLLOW UP: Our staff will call the number listed on your records the next business day following your procedure to check on you and address any questions or concerns that you may have regarding the information given to you following your procedure. If we do not reach you, we will leave a message.  However, if you are feeling well and you are not experiencing any problems, there is no need to return our call.  We will assume that you have returned to your regular daily activities without incident.  If any biopsies were taken you will be contacted by phone or by letter within the next 1-3 weeks.  Please call us at [phone] if you have not heard about the biopsies in 3 weeks.    SIGNATURES/CONFIDENTIALITY: You and/or your care partner have signed paperwork which will be entered into your electronic medical record.  These signatures attest to the fact that that the information above on your After Visit Summary has been reviewed and is understood.  Full responsibility of the confidentiality of this discharge information lies with you and/or your care-partner.

## 2016-04-07 NOTE — Progress Notes (Signed)
Report to PACU, RN, vss, BBS= Clear.  

## 2016-04-08 ENCOUNTER — Telehealth: Payer: Self-pay

## 2016-04-08 NOTE — Telephone Encounter (Signed)
  Follow up Call-  Call back number 04/07/2016  Post procedure Call Back phone  # #[phone] cell  Permission to leave phone message Yes     Patient reports a minor sore throat. I suggested over the counter remedies to help with the soreness. Cough drops, warm tea with honey. Patient states that she is doing those things and feels like it is just a side effect from the procedure. Patient said that she would call if her symptoms worsened, but did not feel that anything else was necessary at this point in time.   Patient questions:  Do you have a fever, pain , or abdominal swelling? No. Pain Score  2 *  Have you tolerated food without any problems? Yes.    Have you been able to return to your normal activities? Yes.    Do you have any questions about your discharge instructions: Diet   No. Medications  No. Follow up visit  No.  Do you have questions or concerns about your Care? No.  Actions: * If pain score is 4 or above: No action needed, pain <4.

## 2016-04-14 ENCOUNTER — Encounter: Payer: Self-pay | Admitting: Gastroenterology

## 2016-04-21 ENCOUNTER — Encounter: Payer: Self-pay | Admitting: Family Medicine

## 2016-05-06 LAB — HM DIABETES EYE EXAM

## 2016-05-12 ENCOUNTER — Encounter: Payer: Self-pay | Admitting: Family Medicine

## 2016-06-30 ENCOUNTER — Ambulatory Visit (INDEPENDENT_AMBULATORY_CARE_PROVIDER_SITE_OTHER): Payer: BC Managed Care – PPO | Admitting: Family Medicine

## 2016-06-30 ENCOUNTER — Encounter: Payer: Self-pay | Admitting: Family Medicine

## 2016-06-30 VITALS — BP 104/62 | HR 72 | Ht 64.0 in | Wt 153.0 lb

## 2016-06-30 DIAGNOSIS — M25511 Pain in right shoulder: Secondary | ICD-10-CM

## 2016-06-30 DIAGNOSIS — Z23 Encounter for immunization: Secondary | ICD-10-CM

## 2016-06-30 DIAGNOSIS — E7800 Pure hypercholesterolemia, unspecified: Secondary | ICD-10-CM | POA: Diagnosis not present

## 2016-06-30 DIAGNOSIS — E559 Vitamin D deficiency, unspecified: Secondary | ICD-10-CM | POA: Diagnosis not present

## 2016-06-30 DIAGNOSIS — Z0000 Encounter for general adult medical examination without abnormal findings: Secondary | ICD-10-CM | POA: Diagnosis not present

## 2016-06-30 DIAGNOSIS — E119 Type 2 diabetes mellitus without complications: Secondary | ICD-10-CM | POA: Diagnosis not present

## 2016-06-30 LAB — POCT URINALYSIS DIPSTICK
BILIRUBIN UA: NEGATIVE
Leukocytes, UA: NEGATIVE
Nitrite, UA: NEGATIVE
Protein, UA: NEGATIVE
RBC UA: NEGATIVE
Urobilinogen, UA: NEGATIVE
pH, UA: 5

## 2016-06-30 NOTE — Progress Notes (Signed)
Chief Complaint  Patient presents with  . Annual Exam    fasting annual exam with pap. Did not do eye exam just had one with Dr.Koop.   Vanessa [name] is a 49 y.o. female who presents for a complete physical.  She has the following concerns:  Dysphagia--she had mild esophageal stenosis noted on EGD in April, which was dilated. Biopsies taken were benign. No further problems with dysphagia since the procedure.  Diabetes--she is under the care of Dr. Balan.  Sugars have been running 100-122. Last A1c was 6.9 in 02/2016. Diabetic eye exam was 04/2016 She reports never having her urine checked at Dr. Balan's office. She checks her feet regularly without ocncerns.  Hyperlipidemia:  Managed by Dr. Balan.  Last checked in March 2017; TC 155, TG 32, LDL 90, HDL 59, TC/chol ratio 2.6. Following low cholesterol diet, and denies side effects from statin.  H/o Vitamin D deficiency--adequately replaced; last checked 02/2016, and was 45.3.  R shoulder pain--previously saw chiro, but was too expensive for ongoing treatments. She can't remember the exercises she was supposed to do. She has noticed it more since throwing the softball around recently.   Immunization History  Administered Date(s) Administered  . Influenza Split 10/21/2009, 10/12/2011, 09/05/2012, 09/04/2014  . Influenza,inj,Quad PF,36+ Mos 08/27/2015  . Pneumococcal Polysaccharide-23 07/20/2007  . Tdap 06/18/2006   Last Pap smear: 06/2015 normal (candida seen); no high risk HPV detected Last mammogram: 01/2016 Last colonoscopy: 05/2010 Last DEXA: never Dentist: twice yearly Ophtho: yearly with [facility]; got glasses this year Exercise: softball twice a week--hasn't started yet, but has been throwing the ball some. She has been walking with her neighbor 40 minutes 3-4 days/week. She gets 10K steps at least 3-4x/week. No weight-bearing exercise.  Past Medical History  Diagnosis Date  . Diabetes mellitus   . Hyperlipidemia   . Carpal  tunnel syndrome, bilateral     h/o  . Acne 07/2009    s/p Accutane (derm in [place])  . Trochanteric bursitis 10/09    right  . Anemia 11/09    iron deficient--resolved after ablation  . Vitamin D deficiency   . Fibrocystic breast 11/2009    biopsy R breast   . H/O menorrhagia   . Allergy   . Dysphagia     s/p esophageal dilatation 04/2016    Past Surgical History  Procedure Laterality Date  . Cesarean section  2002, 2005    x 2  . Endometrial ablation  09/2010    Dr. Haygood  . Tubal ligation  2005  . Carpal tunnel release  R 12/08, L 2003    bilateral, Dr. Gramig  . Appendectomy  1988  . Colonoscopy      Social History   Social History  . Marital Status: Married    Spouse Name: N/A  . Number of Children: 2  . Years of Education: N/A   Occupational History  . 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 Smoking status: Never Smoker   . Smokeless tobacco: Never Used  . Alcohol Use: Yes     Comment: maybe one drink per year.  . Drug Use: No  . Sexual Activity:    Partners: Male    Birth Control/ Protection: Surgical   Other Topics Concern  . Not on file   Social History Narrative   Lives with husband, son, daughter    Family History  Problem   Relation Age of Onset  . Heart disease Father     MI in 5's  . Diabetes Father   . Diabetes Maternal Grandmother   . Diabetes Maternal Grandfather   . Prostate cancer Paternal Grandfather   . Colon cancer Neg Hx   . Esophageal cancer Neg Hx   . Pancreatic cancer Neg Hx   . Rectal cancer Neg Hx   . Stomach cancer Neg Hx     Outpatient Encounter Prescriptions as of 06/30/2016  Medication Sig Note  . cholecalciferol (VITAMIN D) 1000 UNITS tablet Take 2,000 Units by mouth daily. Reported on 02/24/2016   . JANUMET XR 50-1000 MG TB24 Take 1 tablet by mouth 2 (two) times daily with a meal. 02/24/2016: Received from: External Pharmacy  . ONE  TOUCH ULTRA TEST test strip  02/02/2015: Received from: External Pharmacy  . simvastatin (ZOCOR) 20 MG tablet Take 20 mg by mouth every evening.   . [DISCONTINUED] omeprazole (PRILOSEC) 40 MG capsule Take 1 capsule (40 mg total) by mouth daily.   . [DISCONTINUED] Probiotic Product (PROBIOTIC DAILY PO) Take 1 tablet by mouth daily. Reported on 02/24/2016    No facility-administered encounter medications on file as of 06/30/2016.    Allergies  Allergen Reactions  . Minocycline Rash    ROS: The patient denies anorexia, fever, weight changes, headaches, vision changes, decreased hearing, ear pain, sore throat, breast concerns, chest pain, palpitations, dizziness, syncope, dyspnea on exertion, cough, swelling, nausea, vomiting, diarrhea, constipation, abdominal pain, melena, hematochezia, indigestion/heartburn, hematuria, incontinence, dysuria, vaginal discharge, odor or itch, genital lesions, joint pains, numbness, tingling, weakness, tremor, suspicious skin lesions, depression, anxiety, abnormal bleeding/bruising, or enlarged lymph nodes.  No vaginal bleeding or spotting since her ablation. R shoulder pain as per HPI Down 3# from her physical last year (2# from her March visit)   PHYSICAL EXAM:  BP 104/62 mmHg  Pulse 72  Ht 5' 4" (1.626 m)  Wt 153 lb (69.4 kg)  BMI 26.25 kg/m2  General Appearance:   Alert, cooperative, no distress, appears stated age  Head:   Normocephalic, without obvious abnormality, atraumatic  Eyes:   PERRL, conjunctiva/corneas clear, EOM's intact, fundi   benign  Ears:   Normal TM's and external ear canals  Nose:  Nares normal, mucosa mildly edematous, no erythema, no drainage or sinus tenderness  Throat:  Lips, mucosa, and tongue normal; teeth and gums normal.   Neck:  Supple, no lymphadenopathy; thyroid: no enlargement/tenderness/nodules; no carotid  bruit or JVD  Back:  Spine nontender, no curvature, ROM normal, no  CVA tenderness.   Lungs:   Clear to auscultation bilaterally without wheezes, rales or ronchi; respirations unlabored  Chest Wall:   No tenderness or deformity  Heart:   Regular rate and rhythm, S1 and S2 normal, no murmur, rub  or gallop  Breast Exam:   breasts appear normal, no suspicious masses, no skin or nipple changes or axillary nodes.  Abdomen:   Soft, non-tender, nondistended, normoactive bowel sounds,   no masses, no hepatosplenomegaly. Low transverse well healed incision.  Genitalia:   normal external genitalia without lesions. BUS and vagina normal. No cervical motion tenderness. Uterus and adnexa are nontender, no masses. No abnormal vaginal discharge. Pap not obtained  Rectal:  normal sphincter tone, no masses; heme negative stool  Extremities:  No clubbing, cyanosis or edema  Pulses:  2+ and symmetric all extremities  Skin:  Skin color, texture, turgor normal, no rashes or lesions  Lymph nodes:  Cervical, supraclavicular, and  axillary nodes normal  Neurologic:  CNII-XII intact, normal strength, sensation and gait; reflexes 2+ and symmetric throughout   Psych: Normal mood, affect, hygiene and grooming             Normal diabetic foot exam  Labs from Dr. Balan's office 02/24/16: c-met normal, except glu 106 Normal CBC, Hg 13.5 A1c 6.9 Chol 155; HDL 59; TG 32; LDL 90; chol/HDL ratio 2.6 Vitamin D-OH 45.3  ASSESSMENT/PLAN:  Annual physical exam - Plan: POCT Urinalysis Dipstick  Pure hypercholesterolemia - at goal, continue statin  Vitamin D deficiency - adequately replaced; continue daily supplement  Type 2 diabetes mellitus without complication, without long-term current use of insulin (HCC) - controlled - Plan: Microalbumin / creatinine urine ratio  Immunization due - Plan: Td : Tetanus/diphtheria >7yo Preservative  free  Right shoulder pain - Mild rotator cuff  tendinopathy/tendinosis on MR 01/2015. Exercises given; consider PT or injection if worse   Discussed monthly self breast exams and yearly mammograms; at least 30 minutes of aerobic activity at least 5 days/week, weight-bearing exercise at least 2x/week; proper sunscreen use reviewed; healthy diet, including goals of calcium and vitamin D intake and alcohol recommendations (less than or equal to 1 drink/day) reviewed; regular seatbelt use; changing batteries in smoke detectors. Immunization recommendations discussed--flu shots yearly in the fall.Td today. Colonoscopy recommendations reviewed--UTD  

## 2016-06-30 NOTE — Patient Instructions (Signed)
HEALTH MAINTENANCE RECOMMENDATIONS:  It is recommended that you get at least 30 minutes of aerobic exercise at least 5 days/week (for weight loss, you may need as much as 60-90 minutes). This can be any activity that gets your heart rate up. This can be divided in 10-15 minute intervals if needed, but try and build up your endurance at least once a week.  Weight bearing exercise is also recommended twice weekly.  Eat a healthy diet with lots of vegetables, fruits and fiber.  "Colorful" foods have a lot of vitamins (ie green vegetables, tomatoes, red peppers, etc).  Limit sweet tea, regular sodas and alcoholic beverages, all of which has a lot of calories and sugar.  Up to 1 alcoholic drink daily may be beneficial for women (unless trying to lose weight, watch sugars).  Drink a lot of water.  Calcium recommendations are 1200-1500 mg daily (1500 mg for postmenopausal women or women without ovaries), and vitamin D 1000 IU daily.  This should be obtained from diet and/or supplements (vitamins), and calcium should not be taken all at once, but in divided doses.  Monthly self breast exams and yearly mammograms for women over the age of 140 is recommended.  Sunscreen of at least SPF 30 should be used on all sun-exposed parts of the skin when outside between the hours of 10 am and 4 pm (not just when at beach or pool, but even with exercise, golf, tennis, and yard work!)  Use a sunscreen that says "broad spectrum" so it covers both UVA and UVB rays, and make sure to reapply every 1-2 hours.  Remember to change the batteries in your smoke detectors when changing your clock times in the spring and fall.  Use your seat belt every time you are in a car, and please drive safely and not be distracted with cell phones and texting while driving.   Generic Shoulder Exercises  Fill in the blanks below using the following-- Hold for 5-10 seconds, 10 repetitions, 1-2x/day EXERCISES  RANGE OF MOTION (ROM) AND  STRETCHING EXERCISES These exercises may help you when beginning to rehabilitate your injury. Your symptoms may resolve with or without further involvement from your physician, physical therapist or athletic trainer. While completing these exercises, remember:   Restoring tissue flexibility helps normal motion to return to the joints. This allows healthier, less painful movement and activity.  An effective stretch should be held for at least 30 seconds.  A stretch should never be painful. You should only feel a gentle lengthening or release in the stretched tissue. ROM - Pendulum  Bend at the waist so that your right / left arm falls away from your body. Support yourself with your opposite hand on a solid surface, such as a table or a countertop.  Your right / left arm should be perpendicular to the ground. If it is not perpendicular, you need to lean over farther. Relax the muscles in your right / left arm and shoulder as much as possible.  Gently sway your hips and trunk so they move your right / left arm without any use of your right / left shoulder muscles.  Progress your movements so that your right / left arm moves side to side, then forward and backward, and finally, both clockwise and counterclockwise.  Complete __________ repetitions in each direction. Many people use this exercise to relieve discomfort in their shoulder as well as to gain range of motion. Repeat __________ times. Complete this exercise __________ times per day.  STRETCH - Flexion, Standing  Stand with good posture. With an underhand grip on your right / left hand and an overhand grip on the opposite hand, grasp a broomstick or cane so that your hands are a little more than shoulder-width apart.  Keeping your right / left elbow straight and shoulder muscles relaxed, push the stick with your opposite hand to raise your right / left arm in front of your body and then overhead. Raise your arm until you feel a stretch in  your right / left shoulder, but before you have increased shoulder pain.  Try to avoid shrugging your right / left shoulder as your arm rises by keeping your shoulder blade tucked down and toward your mid-back spine. Hold __________ seconds.  Slowly return to the starting position. Repeat __________ times. Complete this exercise __________ times per day. STRETCH - Internal Rotation  Place your right / left hand behind your back, palm-up.  Throw a towel or belt over your opposite shoulder. Grasp the towel/belt with your right / left hand.  While keeping an upright posture, gently pull up on the towel/belt until you feel a stretch in the front of your right / left shoulder.  Avoid shrugging your right / left shoulder as your arm rises by keeping your shoulder blade tucked down and toward your mid-back spine.  Hold __________. Release the stretch by lowering your opposite hand. Repeat __________ times. Complete this exercise __________ times per day. STRETCH - External Rotation and Abduction  Stagger your stance through a doorframe. It does not matter which foot is forward.  As instructed by your physician, physical therapist or athletic trainer, place your hands:  And forearms above your head and on the door frame.  And forearms at head-height and on the door frame.  At elbow-height and on the door frame.  Keeping your head and chest upright and your stomach muscles tight to prevent over-extending your low-back, slowly shift your weight onto your front foot until you feel a stretch across your chest and/or in the front of your shoulders.  Hold __________ seconds. Shift your weight to your back foot to release the stretch. Repeat __________ times. Complete this stretch __________ times per day.  STRENGTHENING EXERCISES  These exercises may help you when beginning to rehabilitate your injury. They may resolve your symptoms with or without further involvement from your physician,  physical therapist or athletic trainer. While completing these exercises, remember:   Muscles can gain both the endurance and the strength needed for everyday activities through controlled exercises.  Complete these exercises as instructed by your physician, physical therapist or athletic trainer. Progress the resistance and repetitions only as guided.  You may experience muscle soreness or fatigue, but the pain or discomfort you are trying to eliminate should never worsen during these exercises. If this pain does worsen, stop and make certain you are following the directions exactly. If the pain is still present after adjustments, discontinue the exercise until you can discuss the trouble with your clinician.  If advised by your physician, during your recovery, avoid activity or exercises which involve actions that place your right / left hand or elbow above your head or behind your back or head. These positions stress the tissues which are trying to heal. STRENGTH - Scapular Depression and Adduction  With good posture, sit on a firm chair. Supported your arms in front of you with pillows, arm rests or a table top. Have your elbows in line with the sides of  your body.  Gently draw your shoulder blades down and toward your mid-back spine. Gradually increase the tension without tensing the muscles along the top of your shoulders and the back of your neck.  Hold for __________ seconds. Slowly release the tension and relax your muscles completely before completing the next repetition.  After you have practiced this exercise, remove the arm support and complete it in standing as well as sitting. Repeat __________ times. Complete this exercise __________ times per day.  STRENGTH - External Rotators  Secure a rubber exercise band/tubing to a fixed object so that it is at the same height as your right / left elbow when you are standing or sitting on a firm surface.  Stand or sit so that the secured  exercise band/tubing is at your side that is not injured.  Bend your elbow 90 degrees. Place a folded towel or small pillow under your right / left arm so that your elbow is a few inches away from your side.  Keeping the tension on the exercise band/tubing, pull it away from your body, as if pivoting on your elbow. Be sure to keep your body steady so that the movement is only coming from your shoulder rotating.  Hold __________ seconds. Release the tension in a controlled manner as you return to the starting position. Repeat __________ times. Complete this exercise __________ times per day.  STRENGTH - Supraspinatus  Stand or sit with good posture. Grasp a __________ weight or an exercise band/tubing so that your hand is "thumbs-up," like when you shake hands.  Slowly lift your right / left hand from your thigh into the air, traveling about 30 degrees from straight out at your side. Lift your hand to shoulder height or as far as you can without increasing any shoulder pain. Initially, many people do not lift their hands above shoulder height.  Avoid shrugging your right / left shoulder as your arm rises by keeping your shoulder blade tucked down and toward your mid-back spine.  Hold for __________ seconds. Control the descent of your hand as you slowly return to your starting position. Repeat __________ times. Complete this exercise __________ times per day.  STRENGTH - Shoulder Extensors  Secure a rubber exercise band/tubing so that it is at the height of your shoulders when you are either standing or sitting on a firm arm-less chair.  With a thumbs-up grip, grasp an end of the band/tubing in each hand. Straighten your elbows and lift your hands straight in front of you at shoulder height. Step back away from the secured end of band/tubing until it becomes tense.  Squeezing your shoulder blades together, pull your hands down to the sides of your thighs. Do not allow your hands to go behind  you.  Hold for __________ seconds. Slowly ease the tension on the band/tubing as you reverse the directions and return to the starting position. Repeat __________ times. Complete this exercise __________ times per day.  STRENGTH - Scapular Retractors  Secure a rubber exercise band/tubing so that it is at the height of your shoulders when you are either standing or sitting on a firm arm-less chair.  With a palm-down grip, grasp an end of the band/tubing in each hand. Straighten your elbows and lift your hands straight in front of you at shoulder height. Step back away from the secured end of band/tubing until it becomes tense.  Squeezing your shoulder blades together, draw your elbows back as you bend them. Keep your upper arm  lifted away from your body throughout the exercise.  Hold __________ seconds. Slowly ease the tension on the band/tubing as you reverse the directions and return to the starting position. Repeat __________ times. Complete this exercise __________ times per day. STRENGTH - Scapular Depressors  Find a sturdy chair without wheels, such as a from a dining room table.  Keeping your feet on the floor, lift your bottom from the seat and lock your elbows.  Keeping your elbows straight, allow gravity to pull your body weight down. Your shoulders will rise toward your ears.  Raise your body against gravity by drawing your shoulder blades down your back, shortening the distance between your shoulders and ears. Although your feet should always maintain contact with the floor, your feet should progressively support less body weight as you get stronger.  Hold __________ seconds. In a controlled and slow manner, lower your body weight to begin the next repetition. Repeat __________ times. Complete this exercise __________ times per day.    This information is not intended to replace advice given to you by your health care provider. Make sure you discuss any questions you have with  your health care provider.   Document Released: 10/19/2005 Document Revised: 12/26/2014 Document Reviewed: 03/19/2009 Elsevier Interactive Patient Education [employer]

## 2016-07-01 LAB — MICROALBUMIN / CREATININE URINE RATIO
Creatinine, Urine: 215 mg/dL (ref 20–320)
Microalb Creat Ratio: 3 mcg/mg creat (ref <30)
Microalb, Ur: 0.6 mg/dL

## 2016-07-04 ENCOUNTER — Encounter: Payer: Self-pay | Admitting: Family Medicine

## 2016-08-31 ENCOUNTER — Ambulatory Visit (INDEPENDENT_AMBULATORY_CARE_PROVIDER_SITE_OTHER): Payer: BC Managed Care – PPO | Admitting: Family Medicine

## 2016-08-31 ENCOUNTER — Encounter: Payer: Self-pay | Admitting: Family Medicine

## 2016-08-31 VITALS — BP 110/68 | HR 64 | Ht 64.0 in | Wt 153.6 lb

## 2016-08-31 DIAGNOSIS — M791 Myalgia: Secondary | ICD-10-CM | POA: Diagnosis not present

## 2016-08-31 DIAGNOSIS — M7918 Myalgia, other site: Secondary | ICD-10-CM

## 2016-08-31 DIAGNOSIS — M79601 Pain in right arm: Secondary | ICD-10-CM

## 2016-08-31 DIAGNOSIS — Z23 Encounter for immunization: Secondary | ICD-10-CM | POA: Diagnosis not present

## 2016-08-31 MED ORDER — MELOXICAM 15 MG PO TABS: 15.0000 mg | ORAL_TABLET | Freq: Every day | ORAL | 0 refills | Status: DC

## 2016-08-31 NOTE — Progress Notes (Signed)
Chief Complaint  Patient presents with  . Leg Pain    left leg pain x 1 month. Fell playing softball. Could not sit for at least 3 days, hurt down the back of her leg as well. Also her right arm hurts. Shooting pain.    She fell a month ago playing softball.  She had no pain initially, but later that evening she developed pain in the left buttock--could hardly sit down due to pain.  Initially it was just in the buttock, over the portion "where you would sit down", but over the last couple of weeks it has been shooting down the leg. Pain is posteriorly in the thigh, and just below the back of the knee.  Denies numbness, tingling, weakness. She almost fell the other day when wearing heels (due to pain).  She has been taking Aleve off and on (not regular), stretching.  She iced it initially, used heat just a couple of times (not recent). She thinks the stretching has helped some, but has persistent pain in the left buttock area.  She is also complaining of right arm pain--mainly at the lateral and posterior aspect of the elbow.  Onset is not related to the fall, just from playing softball (right-handed).  She has had some chronic issues with her shoulder related to softball in the past as well, and she continues to have some shoulder discomfort.  Previously saw chiropractor (Dr. Vear Clock), but insurance changed and would be too expensive now.  PMH, PSH, SH reviewed.  Outpatient Encounter Prescriptions as of 08/31/2016  Medication Sig Note  . cholecalciferol (VITAMIN D) 1000 UNITS tablet Take 2,000 Units by mouth daily. Reported on 02/24/2016   . JANUMET XR 50-1000 MG TB24 Take 1 tablet by mouth 2 (two) times daily with a meal. 02/24/2016: Received from: External Pharmacy  . ONE TOUCH ULTRA TEST test strip  02/02/2015: Received from: External Pharmacy  . simvastatin (ZOCOR) 20 MG tablet Take 20 mg by mouth every evening.   . meloxicam (MOBIC) 15 MG tablet Take 1 tablet (15 mg total) by mouth daily.     No facility-administered encounter medications on file as of 08/31/2016.    (meloxicam rx'd today, not prior to visit; used aleve prn prior to today).  Allergies  Allergen Reactions  . Minocycline Rash    ROS:  No fever, chills, URI symptoms, bleeding, bruising, rash, nausea, vomiting, abdominal pain, numbness, tingling, weakness, back pain.  See HPI.  PHYSICAL EXAM: BP 110/68 (BP Location: Left Arm, Patient Position: Sitting, Cuff Size: Normal)   Pulse 64   Ht 5\' 4"  (1.626 m)   Wt 153 lb 9.6 oz (69.7 kg)   BMI 26.37 kg/m   Well appearing, somewhat tired/frustrated appearing female in no acute distress Diffusely mildly tender at the right elbow--at the olecranon process (at lateral aspect), in the ulnar groove/nerve area, and along the lateral epicondyle.  No significant pain with wrist extension/flexion/supination/pronation. Normal strength. Normal sensation  Spine is nontender, mildly tender at the L SI joint.  Some pain with pyriformis stretch, but good ROM. No muscle spasm Normal strength, sensation, negative straight leg raise. DTR's symmetric Area of discomfort is at ischial tuberosity, deep buttock area  ASSESSMENT/PLAN:  Right arm pain - Plan: Ambulatory referral to Sports Medicine, meloxicam (MOBIC) 15 MG tablet  Left buttock pain - Plan: Ambulatory referral to Sports Medicine, meloxicam (MOBIC) 15 MG tablet  Need for prophylactic vaccination and inoculation against influenza - Plan: Flu Vaccine QUAD 36+ mos PF IM (  Fluarix & Fluzone Quad PF)  Suspect muscle pull (hamstring), improving. NSAIDs, heat, stretches. Shoulder and elbow problems related to overuse from softball--refer to Sports Med.   Take meloxicam once daily with food for up to 2 weeks (you can stop when pain has completely resolved, using it daily until then, or finishing the whole bottle if pain persists). Do not use other anti-inflammatories or pain medications Tylenol IS okay to use along with it,  but no others.  Heat to the buttock and arm/shoulder area. Ice the arm after softball. Ideally, you should let your arm REST.  We are going to refer you to a sports medicine doctor for further evaluation/treatment (mainly for your arm, hopefully the other area will resolve on its own).  Refer to sports medicine for evaluation/treatment.

## 2016-08-31 NOTE — Patient Instructions (Signed)
  Take meloxicam once daily with food for up to 2 weeks (you can stop when pain has completely resolved, using it daily until then, or finishing the whole bottle if pain persists). Do not use other anti-inflammatories or pain medications Tylenol IS okay to use along with it, but no others.  Heat to the buttock and arm/shoulder area. Ice the arm after softball. Ideally, you should let your arm REST.  We are going to refer you to a sports medicine doctor for further evaluation/treatment (mainly for your arm, hopefully the other area will resolve on its own).

## 2016-09-02 ENCOUNTER — Ambulatory Visit (INDEPENDENT_AMBULATORY_CARE_PROVIDER_SITE_OTHER): Payer: BC Managed Care – PPO | Admitting: Sports Medicine

## 2016-09-02 ENCOUNTER — Encounter: Payer: Self-pay | Admitting: Sports Medicine

## 2016-09-02 ENCOUNTER — Ambulatory Visit
Admission: RE | Admit: 2016-09-02 | Discharge: 2016-09-02 | Disposition: A | Discharge status: Home / Self Care | Payer: BC Managed Care – PPO | Source: Ambulatory Visit | Attending: Sports Medicine | Admitting: Sports Medicine

## 2016-09-02 VITALS — BP 117/62 | HR 77 | Ht 65.0 in | Wt 152.0 lb

## 2016-09-02 DIAGNOSIS — M6790 Unspecified disorder of synovium and tendon, unspecified site: Secondary | ICD-10-CM | POA: Diagnosis not present

## 2016-09-02 DIAGNOSIS — M25552 Pain in left hip: Secondary | ICD-10-CM

## 2016-09-02 MED ORDER — NAPROXEN 500 MG PO TABS: 500.0000 mg | ORAL_TABLET | Freq: Two times a day (BID) | ORAL | 0 refills | Status: DC | PRN

## 2016-09-02 NOTE — Assessment & Plan Note (Signed)
Suspect that patient has a contusion vs hematoma at the left ischium.  Possible tendinopathy of the hamstring given decreased strength and hip flexion as well.  Will obtain an A/P pelvic xray to r/o fracture.  Dr Margaretha Sheffieldraper to contact with results of study.  Patient to discontinue use of Mobic and start Naproxen, since this works better for her.  Will have patient return in 1 week for ultrasound of the affected area to evaluate for hematoma.  Pain is improving, which is reassuring.  Doubt tear at this point.

## 2016-09-02 NOTE — Progress Notes (Signed)
Subjective:    Patient ID: [name], female    DOB: [birth year], 50 y.o.   MRN: [id number]  HPI Vanessa [name] is a 50 y.o. female that presents for left buttock pain and right shoulder pain.  1. Left buttock pain Patient reports that about 1 month ago, she was playing soft ball and was knocked down onto her left buttock/hip.  She reports that she was able to complete the game but experienced stiffness on her car ride home.  She soaked in epsom salt with moderate relief.  She reports that around 3am she woke up with a severe cramp in her buttock.  She has since been having soreness of her left buttock.  Pain worsens with reaching for things on the floor, direct contact to left buttock and sometimes with walking.  She notes that she takes walks each day and sometimes experiences shooting/ electric pain down her left leg.  She reports sensation of leg wanting to give way during these episodes.  She has been using heat/ice and stretching.  She has been using NSAIDs prn.  She notes that pain is better since injury but is still not resolved.  She denies weakness, numbness, tingling otherwise.    2. Right shoulder pain Patient reports about a 2 year history of right shoulder pain.  She points to the posterio-lateral aspect of her shoulder as the main area.  She notes that now she has a constant ache of her shoulder with radiation down to her elbow. She reports h/o clavicle fracture on the right as a child.  Otherwise, no injury.  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  She reports increased pain with internal and external rotation.  She has been applying Biofreeze and using Mobic with little improvement.  She has seen a chiropractor with minimal improvement.  Pain does occ wake her from sleep.  Denies weakness, numbness, tingling.  Addtionally, she had an MRI in 01/2015 that showed tendinopathy of rotator  cuff.  Review of Systems Per HPI    Objective:   Physical Exam Gen: awake, alert, well appearing female MSK: normal gait, normal heel walk, normal toe walk  LLE: Full PROM, H test with about 20 degree loss of hip flexion on L (70 degrees) compared to R (90 degrees), 4/5 hamstring strength with IR of the foot.  +TTP to ischial tuberosity  R shoulder: full painless AROM, + TTP to posteriorlateral aspect of shoulder, no bony deformities appreciated, 4/5 supraspinatus strength, +empty can, negative crossover test Neuro: light touch sensation in tact UE and LE bilaterally Skin: no erythema, ecchymosis appreciated    Assessment & Plan:   Left hip pain Suspect that patient has a contusion vs hematoma at the left ischium.  Possible tendinopathy of the hamstring given decreased strength and hip flexion as well.  Will obtain an A/P pelvic xray to r/o fracture.  Dr Margaretha Sheffield to contact with results of study.  Patient to discontinue use of Mobic and start Naproxen, since this works better for her.  Will have patient return in 1 week for ultrasound of the affected area to evaluate for hematoma.  Pain is improving, which is reassuring.  Doubt tear at this point.  Tendinopathy right shoudler Chronic.  2016 MRI report reviewed.  Patient to Stop Mobic and start Naprosyn.  HEP to include Jobe exercises.  These were reviewed with patient.  If  no improvement, could consider ultrasound to review anatomy.  Also discussed possible corticosteroid injection.  Patient seems less excited about injection but is amenable if pain is persistent.     Vanessa M. Nadine CountsGottschalk, DO PGY-3, [facility]  Patient seen and evaluated with the resident. I agree with the above plan of care. We're going to start with x-rays of the pelvis to rule out possible ischial tuberosity fracture. If unremarkable the patient will return to the office next week for an ultrasound evaluation of the posterior hip. Although her pain  persists it has improved dramatically since her injury. I'm going to change her meloxicam to naproxen sodium. She will take it as needed. In regards to her chronic right shoulder pain, we've given her a Jobe home exercise program to start doing. We could consider an ultrasound of her shoulder at a later date if needed. Hopefully the naproxen sodium will help with this as well.

## 2016-09-02 NOTE — Assessment & Plan Note (Signed)
Chronic.  2016 MRI report reviewed.  Patient to Stop Mobic and start Naprosyn.  HEP to include Jobe exercises.  These were reviewed with patient.  If no improvement, could consider ultrasound to review anatomy.  Also discussed possible corticosteroid injection.  Patient seems less excited about injection but is amenable if pain is persistent.

## 2016-09-09 ENCOUNTER — Encounter: Payer: Self-pay | Admitting: Sports Medicine

## 2016-09-09 ENCOUNTER — Other Ambulatory Visit: Payer: Self-pay

## 2016-09-09 ENCOUNTER — Ambulatory Visit (INDEPENDENT_AMBULATORY_CARE_PROVIDER_SITE_OTHER): Payer: BC Managed Care – PPO | Admitting: Sports Medicine

## 2016-09-09 VITALS — BP 119/70 | Ht 64.0 in | Wt 153.0 lb

## 2016-09-09 DIAGNOSIS — M6790 Unspecified disorder of synovium and tendon, unspecified site: Secondary | ICD-10-CM

## 2016-09-09 NOTE — Progress Notes (Signed)
   Subjective:    Patient ID: Vanessa [name], female    DOB: [birth year], 50 y.o.   MRN: [id number]  HPI   Patient comes in today for follow-up on left posterior hip and right shoulder pain. X-rays of the pelvis show no obvious fracture at the ischial tuberosity. Her pain here is slowly improving. Her main complaint today is chronic right shoulder pain. She localizes it to the posterior lateral aspect of her shoulder. Pain will occasionally radiate down the arm to the elbow. It is worse after playing softball. An MRI arthrogram of her right shoulder done in February 2016 showed mild rotator cuff tendinopathy without any evidence of tearing. Labrum was unremarkable. Dominant finding was subchondral cystic changes noted near the infraspinatus attachment which can be seen with posterior impingement.    Review of Systems     Objective:   Physical Exam  Well-developed, well-nourished. No acute distress  Right shoulder: Full range of motion. She remains tender to palpation along the posterior lateral aspect of the shoulder. Good rotator cuff strength. Mild pain with empty can testing. No tenderness over the acromioclavicular joint. No tenderness over the bicipital groove. Neurovascularly intact distally.  Left hip: Smooth painless hip range of motion with a negative log roll. She still has some tenderness to palpation at the left ischial tuberosity. Good strength. Walking without a limp.   [facility] ultrasound of the right shoulder was performed. Limited images were obtained. Biceps tendon was well visualized in the longitudinal axis. No abnormality was seen. Acromioclavicular joint was well visualized and within normal limits. Supraspinatus, infraspinatus, and subscapularis tendons were all well-visualized in the longitudinal plane. No obvious tearing. There is a small amount of fluid in the subacromial bursa.       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 The results of that ultrasound are as above. It is fairly unremarkable other than some subacromial bursitis. Her previous MRI scan did show findings consistent with posterior impingement which seems to fit her clinical picture. She has not had any physical therapy so I've recommended that she see Vanessa [name] specifically for treatment for posterior shoulder impingement. She'll follow-up with me in 6 weeks for reevaluation. If her symptoms do not improve with PT then we could consider a referral to orthopedics for arthroscopic debridement but I reassured the patient that she certainly does not have any obvious pathology that requires surgery at this time.  Total time spent with the patient was 30 minutes with greater than 50% of the time spent in face-to-face consultation performing the ultrasound, discussing the results, discussing her diagnosis, and discussing the treatment plan.

## 2016-10-03 ENCOUNTER — Other Ambulatory Visit: Payer: Self-pay | Admitting: *Deleted

## 2016-10-03 DIAGNOSIS — M25552 Pain in left hip: Secondary | ICD-10-CM

## 2016-10-03 MED ORDER — NAPROXEN 500 MG PO TABS: 500.0000 mg | ORAL_TABLET | Freq: Two times a day (BID) | ORAL | 0 refills | Status: DC | PRN

## 2016-10-20 ENCOUNTER — Encounter: Payer: Self-pay | Admitting: Family Medicine

## 2016-10-20 ENCOUNTER — Ambulatory Visit (INDEPENDENT_AMBULATORY_CARE_PROVIDER_SITE_OTHER): Payer: BC Managed Care – PPO | Admitting: Family Medicine

## 2016-10-20 ENCOUNTER — Ambulatory Visit: Payer: BC Managed Care – PPO | Admitting: Family Medicine

## 2016-10-20 VITALS — BP 108/68 | HR 80 | Temp 98.0°F | Ht 64.0 in | Wt 153.4 lb

## 2016-10-20 DIAGNOSIS — R05 Cough: Secondary | ICD-10-CM | POA: Diagnosis not present

## 2016-10-20 DIAGNOSIS — R059 Cough, unspecified: Secondary | ICD-10-CM

## 2016-10-20 DIAGNOSIS — J069 Acute upper respiratory infection, unspecified: Secondary | ICD-10-CM | POA: Diagnosis not present

## 2016-10-20 MED ORDER — BENZONATATE 200 MG PO CAPS: 200.0000 mg | ORAL_CAPSULE | Freq: Three times a day (TID) | ORAL | 0 refills | Status: DC | PRN

## 2016-10-20 NOTE — Patient Instructions (Signed)
  Drink plenty of water. Continue sinus rinses--consider doing it twice daily. You may use a decongestant such as sudafed, or a combination medication such as Claritin D to help with any sinus pressure, runny nose, and to dry up the mucus that is draining down the back of the throat and contributing to the cough. I recommend taking Mucinex--either the plain kind along with the Delsym syrup, or switching to Mucinex DM (which contains the same ingredient as Delsym--don't take both together).  Use the benzonatate that was prescribed as needed for cough.  This can be safely used during the day, shouldn't make you sleepy.  Contact us next week if you have worsening symptoms (ie recurrence of fever, discolored mucus, etc), as you likely then would need an antibiotic. Currently your infection is viral and should clear on its own. Return for re-evaluation if you develop shortness of breath, pain with breathing, or other new symptoms or concerns.

## 2016-10-20 NOTE — Progress Notes (Signed)
Chief Complaint  Patient presents with  . Cough    had fever this past weekend, but gone now. Had been coughing so much but starting feeling better today-had already made appt so she came in. Mucus is greenish/brown in color.    Five days ago she started with Fever 101, headache, sore throat. She then developed cough.  Had some stress incontinence after coughing yesterday. She also vomited up thick, green "phlegm balls" and has been feeling better since then.  Nasal mucus has been yellow-green.  She used the neti-pot last night and got out a lot of yellow phlegm.  She feels much better today. She is still coughing some, but not as bad.  Hacky cough during the day.  No fevers since the weekend.  She has been using Delsym syrup (children's kind) and tylenol.    PMH, PSH, SH reviewed  Outpatient Encounter Prescriptions as of 10/20/2016  Medication Sig Note  . cholecalciferol (VITAMIN D) 1000 UNITS tablet Take 2,000 Units by mouth daily. Reported on 02/24/2016   . JANUMET XR 50-1000 MG TB24 Take 1 tablet by mouth 2 (two) times daily with a meal. 02/24/2016: Received from: External Pharmacy  . ONE TOUCH ULTRA TEST test strip  02/02/2015: Received from: External Pharmacy  . simvastatin (ZOCOR) 20 MG tablet Take 20 mg by mouth every evening.   . benzonatate (TESSALON) 200 MG capsule Take 1 capsule (200 mg total) by mouth 3 (three) times daily as needed.   . [DISCONTINUED] meloxicam (MOBIC) 15 MG tablet Take 1 tablet (15 mg total) by mouth daily.   . [DISCONTINUED] naproxen (NAPROSYN) 500 MG tablet Take 1 tablet (500 mg total) by mouth 2 (two) times daily as needed. TAKE WITH FOOD (Patient not taking: Reported on 10/20/2016)    No facility-administered encounter medications on file as of 10/20/2016.    (benzontate rx'd today, not taking prior to visit)  Allergies  Allergen Reactions  . Minocycline Rash   ROS: no dizziness, chest pain, shortness of breath, diarrhea, rash, bleeding, bruising, ear  pain.  +URI symptoms as per HPI.  PHYSICAL EXAM:  BP 108/68 (BP Location: Right Arm, Patient Position: Sitting, Cuff Size: Normal)   Pulse 80   Temp 98 F (36.7 C) (Tympanic)   Ht 5\' 4"  (1.626 m)   Wt 153 lb 6.4 oz (69.6 kg)   BMI 26.33 kg/m   Well appearing female in no distress.  No coughing during visit HEENT: PERRL, EOMI, conjunctiva and sclera are clear. TM's and EAC's normal.  Nasal mucosa was mod-severely edematous, no erythema, clear mucus noted.  OP shows erythema posteriorly, cobblestoning, otherwise normal. Sinuses are nontender Neck: no lymphadenopathy Heart: regular rate and rhythm Lungs: clear bilateraly--no wheezes, rales or ronchi Skin: normal turgor, no rash or lesions Neuro: alert and oriented, cranial nerves intact, normal strength, gait Psych: normal mood, affect, hygiene and grooming    ASSESSMENT/PLAN:  Acute upper respiratory infection  Cough - Plan: benzonatate (TESSALON) 200 MG capsule   Drink plenty of water. Continue sinus rinses--consider doing it twice daily. You may use a decongestant such as sudafed, or a combination medication such as Claritin D to help with any sinus pressure, runny nose, and to dry up the mucus that is draining down the back of the throat and contributing to the cough. I recommend taking Mucinex--either the plain kind along with the Delsym syrup, or switching to Mucinex DM (which contains the same ingredient as Delsym--don't take both together).  Use the benzonatate that was  prescribed as needed for cough.  This can be safely used during the day, shouldn't make you sleepy.  Contact us next week if you have worsening symptoms (ie recurrence of fever, discolored mucus, etc), as you likely then would need an antibiotic. Currently your infection is viral and should clear on its own. Return for re-evaluation if you develop shortness of breath, pain with breathing, or other new symptoms or concerns.

## 2017-01-11 ENCOUNTER — Ambulatory Visit: Payer: BC Managed Care – PPO | Admitting: Family Medicine

## 2017-01-12 ENCOUNTER — Ambulatory Visit (INDEPENDENT_AMBULATORY_CARE_PROVIDER_SITE_OTHER): Payer: BC Managed Care – PPO | Admitting: Sports Medicine

## 2017-01-12 ENCOUNTER — Encounter: Payer: Self-pay | Admitting: Sports Medicine

## 2017-01-12 VITALS — BP 117/78 | HR 73 | Ht 64.0 in | Wt 152.0 lb

## 2017-01-12 DIAGNOSIS — M25511 Pain in right shoulder: Secondary | ICD-10-CM

## 2017-01-12 MED ORDER — METHYLPREDNISOLONE ACETATE 40 MG/ML IJ SUSP
40.0000 mg | Freq: Once | INTRAMUSCULAR | Status: AC
  Administered 2017-01-12: 40 mg via INTRA_ARTICULAR

## 2017-01-12 NOTE — Progress Notes (Signed)
    Subjective:  Vanessa [name] is a 51 y.o. female [facility] with a chief complaint of right shoulder pain.   HPI:  Right Shoulder Pain Patient with a history of chronic right shoulder pain for the past several years. She had an MRI two years ago which showed rotator cuff tendinopathy without a clear tear. She had been managed conservatively with rehab. Symptoms had improved significantly until about a week ago when she reaggravated her arm while shoveling snow. Pain is mostly located in the posterior aspect of her shoulder but can radiate into her elbow. No weakness or numbness. No tingling sensations.    ROS: Per HPI  PMH: Smoking history reviewed.   Objective:  Physical Exam: BP 117/78   Pulse 73   Ht 5\' 4"  (1.626 m)   Wt 152 lb (68.9 kg)   BMI 26.09 kg/m   Gen: NAD, resting comfortably MSK: R Shoulder: No deformities. Tender to palpation over posterior and superior aspects. FROM without significant pain. Internal rotation 4+/5. 5/5 in other fields including external rotation and abduction. Negative empty can sign. Spurling negative. Neurovascularly intact distally.  US: Normal appearance of biceps tendon, supraspinatus, infraspinatus, and subscapularis. AC joint without degenerative changes, though does have a positive mushroom sign.   Assessment/Plan:  Right Shoulder Pain Likely acute on chronic rotator cuff tendinopathy. No definitive tears or bursitis noted on ultrasound today. Discussed treatment options with patient. Wishes to have injection today and follow up in 4 weeks. Subacromial injection performed today. Continue home exercise program. Discussed possible referral to orthopedics for surgical management if not improving. Patient agreed to this.   Vanessa [name] M. Jimmey RalphParker, MD [facility] Resident PGY-3 01/12/2017 12:55 PM   Patient seen and evaluated with the resident today. I agree with the above plan of care. Patient's ultrasound was  fairly unremarkable other than some fluid at the acromioclavicular joint which could indicate some underlying pathology in the rotator cuff or possibly subacromial bursitis. However, no significant tear in the rotator cuff was seen on her ultrasound and no significant bursal swelling was appreciated. I discussed treatment options with her and she elected to try a subacromial cortisone injection. She will follow-up with me in 4 weeks for reevaluation. If her pain has improved then she will resume her home exercises (she has already done physical therapy and has a good understanding of the exercises to be done). However, if her symptoms persist, I certainly think she should consider a referral to Dr. Dion [name] for possible arthroscopy.  Consent obtained and verified. Time-out conducted. Noted no overlying erythema, induration, or other signs of local infection. Skin prepped in a sterile fashion. Topical analgesic spray: Ethyl chloride. Joint: right shoulder (subacromial) Needle: 25g 1.5 inch Completed without difficulty. Meds: 3cc 1% xylocaine, 1cc (40mg ) depomedrol  Advised to call if fevers/chills, erythema, induration, drainage, or persistent bleeding.

## 2017-02-03 ENCOUNTER — Other Ambulatory Visit: Payer: Self-pay | Admitting: Family Medicine

## 2017-02-03 DIAGNOSIS — Z1231 Encounter for screening mammogram for malignant neoplasm of breast: Secondary | ICD-10-CM

## 2017-02-09 ENCOUNTER — Ambulatory Visit: Payer: BC Managed Care – PPO | Admitting: Sports Medicine

## 2017-02-09 ENCOUNTER — Encounter: Payer: Self-pay | Admitting: *Deleted

## 2017-02-09 NOTE — Patient Instructions (Signed)
[facility] Dion [facility]au [facility] [address] [place] [id number]  [phone]  Monday 02/13/17 at 1130am

## 2017-02-13 ENCOUNTER — Ambulatory Visit
Admission: RE | Admit: 2017-02-13 | Discharge: 2017-02-13 | Disposition: A | Discharge status: Home / Self Care | Payer: BC Managed Care – PPO | Source: Ambulatory Visit | Attending: Family Medicine | Admitting: Family Medicine

## 2017-02-13 DIAGNOSIS — Z1231 Encounter for screening mammogram for malignant neoplasm of breast: Secondary | ICD-10-CM

## 2017-03-07 ENCOUNTER — Ambulatory Visit (INDEPENDENT_AMBULATORY_CARE_PROVIDER_SITE_OTHER): Payer: BC Managed Care – PPO | Admitting: Family Medicine

## 2017-03-07 ENCOUNTER — Encounter: Payer: Self-pay | Admitting: Family Medicine

## 2017-03-07 VITALS — BP 114/80 | HR 81 | Temp 97.8°F | Wt 152.0 lb

## 2017-03-07 DIAGNOSIS — J0110 Acute frontal sinusitis, unspecified: Secondary | ICD-10-CM | POA: Diagnosis not present

## 2017-03-07 MED ORDER — AMOXICILLIN 875 MG PO TABS: 875.0000 mg | ORAL_TABLET | Freq: Two times a day (BID) | ORAL | 0 refills | Status: DC

## 2017-03-07 NOTE — Progress Notes (Signed)
   Subjective:    Patient ID: Vanessa [name], female    DOB: [birth year], 51 y.o.   MRN: [id number]  HPI He complains of a one-week history of sneezing, rhinorrhea, itchy watery eyes, frontal type headache and to a lesser extent in the left maxillary area. She has been using OTC meds for this. No sore throat, fever, chills cough, chest congestion, earache, PND. She has no previous history of allergies and does not smoke.   Review of Systems     Objective:   Physical Exam Alert and in no distress. Nasal mucosa is normal. Tender to palpation over frontal sinus. Tympanic membranes and canals are normal. Pharyngeal area is normal. Neck is supple without adenopathy or thyromegaly. Cardiac exam shows a regular sinus rhythm without murmurs or gallops. Lungs are clear to auscultation.        Assessment & Plan:  Acute non-recurrent frontal sinusitis - Plan: amoxicillin (AMOXIL) 875 MG tablet I will treat her like she has a sinus infection. Her symptoms are suggestive of allergies but she states she has never had allergies. Entirely better when she finishes the antibiotic.

## 2017-03-15 ENCOUNTER — Encounter: Payer: Self-pay | Admitting: Family Medicine

## 2017-03-15 ENCOUNTER — Ambulatory Visit (INDEPENDENT_AMBULATORY_CARE_PROVIDER_SITE_OTHER): Payer: BC Managed Care – PPO | Admitting: Family Medicine

## 2017-03-15 VITALS — BP 110/76 | HR 80 | Temp 99.7°F | Ht 64.0 in | Wt 151.4 lb

## 2017-03-15 DIAGNOSIS — K120 Recurrent oral aphthae: Secondary | ICD-10-CM | POA: Diagnosis not present

## 2017-03-15 DIAGNOSIS — R102 Pelvic and perineal pain: Secondary | ICD-10-CM | POA: Diagnosis not present

## 2017-03-15 DIAGNOSIS — K644 Residual hemorrhoidal skin tags: Secondary | ICD-10-CM | POA: Diagnosis not present

## 2017-03-15 DIAGNOSIS — N939 Abnormal uterine and vaginal bleeding, unspecified: Secondary | ICD-10-CM

## 2017-03-15 MED ORDER — HYDROCORTISONE 2.5 % RE CREA: 1.0000 "application " | TOPICAL_CREAM | Freq: Two times a day (BID) | RECTAL | 0 refills | Status: DC

## 2017-03-15 NOTE — Progress Notes (Signed)
Chief Complaint  Patient presents with  . Hemorrhoids    and spotting with some abdominal cramping, had ablation about ~7 years ago. Also has a few canker sores in her mouth. Saw [facility] last week for sinus infection and is still taking ABX.    Yesterday she was having vaginal spotting and cramping.  She hadn't had any bleeding since her ablation 7 years ago. Cramping and spotting seems to have resolved, none noted today. She has been getting her physicals here, previously saw Dr. Pennie Rushing.  She also had hemorrhoids flare and canker sores develop yesterday "it was a bad day".  Hemorrhoids are swollen and sore, externally. Doesn't believe they are bleeding.  She has been using Preparation H, which might be helping some.  She has been having more frequent bowel movements since the sinus infection/antibiotics. Some stools have been loose.  Denies constipation or straining.  Sinus symptoms are improving.  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 She is seeing someone at [facility] for her neck. She was treated with oral steroids, which helped.  PMH, PSH, SH reviewed  Outpatient Encounter Prescriptions as of 03/15/2017  Medication Sig Note  . amoxicillin (AMOXIL) 875 MG tablet Take 1 tablet (875 mg total) by mouth 2 (two) times daily.   . cholecalciferol (VITAMIN D) 1000 UNITS tablet Take 2,000 Units by mouth daily. Reported on 02/24/2016   . JANUMET XR 50-1000 MG TB24 Take 1 tablet by mouth 2 (two) times daily with a meal. 02/24/2016: Received from: External Pharmacy  . ONE TOUCH ULTRA TEST test strip  02/02/2015: Received from: External Pharmacy  . Probiotic Product (PROBIOTIC DAILY PO) Take 1 capsule by mouth daily.   . simvastatin (ZOCOR) 20 MG tablet Take 20 mg by mouth every evening.    No facility-administered encounter medications on file as of 03/15/2017.     Allergies  Allergen Reactions  . Minocycline Rash     ROS:  She denies any fever, chills. +fatigue,  "don't feel good".  Sinuses are improved, no further cramping or spotting today.  _inflamed hermorrhoids.  Stomach is "churning". Painful canker sore. No dizziness, syncope, chest pain, shortness of breath, urinary complaints.  Slight headache (frontal).  PHYSICAL EXAM:  BP 110/76 (BP Location: Right Arm, Patient Position: Sitting, Cuff Size: Normal)   Pulse 80   Temp 99.7 F (37.6 C) (Tympanic)   Ht 5\' 4"  (1.626 m)   Wt 151 lb 6.4 oz (68.7 kg)   LMP  (LMP Unknown)   BMI 25.99 kg/m   Mildly ill-appearing female in no acute distress HEENT: PERRL, EOMI, conjunctiva and sclera are clear.  OP: Aphthous ulcers--2 small ones on left lower gums. Remainder of OP is clear. Nasal mucosa is mod-severely edematous, no erythema, no purulence Sinuses mildly tender x 4, frontal > maxillary Neck: no lymphadenopathy, thyromegaly or mass Heart: regular rate and rhythm Lungs: clear bilaterally Abdomen: Mildly tender along lateral lower abdomen bilaterally. No rebound tenderness or guarding. No organomegaly or mass Rectal:External hemorrhoid, inflamed/tender, on the right.  No thrombosis.  Not bleeding Pelvic exam: normal external genitalia without lesions. No cervical motion tenderness or uterine tenderness or enlargement. Fullness and tenderness in right adnexa. Nontender on the left  ASSESSMENT/PLAN:  External hemorrhoid - Sitz bath, Anusol HC.  s/sx thrombosed hemorrhoid reviewed, return prn - Plan: hydrocortisone (ANUSOL-HC) 2.5 % rectal cream  Aphthous ulcer of mouth - supportive measures reviewed  Abnormal vaginal bleeding - s/p ablation; given R adnexal pain/fullness, suspect ovarian cyst.  Check US for other potential etiolology. To f/u with Dr. Pennie RushingHaygood if abnl found, or ongoing bl - Plan: US Pelvis Complete, US Transvaginal Non-OB  Adnexal pain - Plan: US Pelvis Complete, US Transvaginal Non-OB   Pelvic US to assess abnormal bleeding and right adnexal pain--r/o cyst vs mass  External  hemorrhoid--Anusol HC

## 2017-03-15 NOTE — Patient Instructions (Signed)
Complete your antibiotics for the sinus infection. I think decongestants such as sudafed will also help with your sinus pressure/headaches.   Use the Anusol [facility] for the external hemorrhoid. See info below.  Use Anbesol or other OTC agents for the canker sore. See info below.  We are going to send you for a pelvic ultrasound.   Hemorrhoids Hemorrhoids are swollen veins in and around the rectum or anus. There are two types of hemorrhoids:  Internal hemorrhoids. These occur in the veins that are just inside the rectum. They may poke through to the outside and become irritated and painful.  External hemorrhoids. These occur in the veins that are outside of the anus and can be felt as a painful swelling or hard lump near the anus. Most hemorrhoids do not cause serious problems, and they can be managed with home treatments such as diet and lifestyle changes. If home treatments do not help your symptoms, procedures can be done to shrink or remove the hemorrhoids. What are the causes? This condition is caused by increased pressure in the anal area. This pressure may result from various things, including:  Constipation.  Straining to have a bowel movement.  Diarrhea.  Pregnancy.  Obesity.  Sitting for long periods of time.  Heavy lifting or other activity that causes you to strain.  Anal sex. What are the signs or symptoms? Symptoms of this condition include:  Pain.  Anal itching or irritation.  Rectal bleeding.  Leakage of stool (feces).  Anal swelling.  One or more lumps around the anus. How is this diagnosed? This condition can often be diagnosed through a visual exam. Other exams or tests may also be done, such as:  Examination of the rectal area with a gloved hand (digital rectal exam).  Examination of the anal canal using a small tube (anoscope).  A blood test, if you have lost a significant amount of blood.  A test to look inside the colon (sigmoidoscopy or  colonoscopy). How is this treated? This condition can usually be treated at home. However, various procedures may be done if dietary changes, lifestyle changes, and other home treatments do not help your symptoms. These procedures can help make the hemorrhoids smaller or remove them completely. Some of these procedures involve surgery, and others do not. Common procedures include:  Rubber band ligation. Rubber bands are placed at the base of the hemorrhoids to cut off the blood supply to them.  Sclerotherapy. Medicine is injected into the hemorrhoids to shrink them.  Infrared coagulation. A type of light energy is used to get rid of the hemorrhoids.  Hemorrhoidectomy surgery. The hemorrhoids are surgically removed, and the veins that supply them are tied off.  Stapled hemorrhoidopexy surgery. A circular stapling device is used to remove the hemorrhoids and use staples to cut off the blood supply to them. Follow these instructions at home: Eating and drinking   Eat foods that have a lot of fiber in them, such as whole grains, beans, nuts, fruits, and vegetables. Ask your health care provider about taking products that have added fiber (fiber supplements).  Drink enough fluid to keep your urine clear or pale yellow. Managing pain and swelling   Take warm sitz baths for 20 minutes, 3-4 times a day to ease pain and discomfort.  If directed, apply ice to the affected area. Using ice packs between sitz baths may be helpful.  Put ice in a plastic bag.  Place a towel between your skin and the bag.  Leave the ice on for 20 minutes, 2-3 times a day. General instructions   Take over-the-counter and prescription medicines only as told by your health care provider.  Use medicated creams or suppositories as told.  Exercise regularly.  Go to the bathroom when you have the urge to have a bowel movement. Do not wait.  Avoid straining to have bowel movements.  Keep the anal area dry and  clean. Use wet toilet paper or moist towelettes after a bowel movement.  Do not sit on the toilet for long periods of time. This increases blood pooling and pain. Contact a health care provider if:  You have increasing pain and swelling that are not controlled by treatment or medicine.  You have uncontrolled bleeding.  You have difficulty having a bowel movement, or you are unable to have a bowel movement.  You have pain or inflammation outside the area of the hemorrhoids. This information is not intended to replace advice given to you by your health care provider. Make sure you discuss any questions you have with your health care provider. Document Released: 12/02/2000 Document Revised: 05/04/2016 Document Reviewed: 08/19/2015 Elsevier Interactive Patient Education  2017 Else[employer] Inc[employer]   Canker Sores Canker sores are small, painful sores that develop inside your mouth. They may also be called aphthous ulcers. You can get canker sores on the inside of your lips or cheeks, on your tongue, or anywhere inside your mouth. You can have just one canker sore or several of them. Canker sores cannot be passed from one person to another (noncontagious). These sores are different than the sores that you may get on the outside of your lips (cold sores or fever blisters). Canker sores usually start as painful red bumps. Then they turn into small white, yellow, or gray ulcers that have red borders. The ulcers may be quite painful. The pain may be worse when you eat or drink. What are the causes? The cause of this condition is not known. What increases the risk? This condition is more likely to develop in:  Women.  People in their teens or 39s.  Women who are having their menstrual period.  People who are under a lot of emotional stress.  People who do not get enough iron or B vitamins.  People who have poor oral hygiene.  People who have an injury inside the mouth. This can happen after having  dental work or from chewing something hard. What are the signs or symptoms? Along with the canker sore, symptoms may also include:  Fever.  Fatigue.  Swollen lymph nodes in your neck. How is this diagnosed? This condition can be diagnosed based on your symptoms. Your health care provider will also examine your mouth. Your health care provider may also do tests if you get canker sores often or if they are very bad. Tests may include:  Blood tests to rule out other causes of canker sores.  Taking swabs from the sore to check for infection.  Taking a small piece of skin from the sore (biopsy) to test it for cancer. How is this treated? Most canker sores clear up without treatment in about 10 days. Home care is usually the only treatment that you will need. Over-the-counter medicines can relieve discomfort.If you have severe canker sores, your health care provider may prescribe:  Numbing ointment to relieve pain.  Vitamins.  Steroid medicines. These may be given as:  Oral pills.  Mouth rinses.  Gels.  Antibiotic mouth rinse. Follow these instructions  at home:  Apply, take, or use medicines only as directed by your health care provider. These include vitamins.  If you were prescribed an antibiotic mouth rinse, finish all of it even if you start to feel better.  Until the sores are healed:  Do not drink coffee or citrus juices.  Do not eat spicy or salty foods.  Use a mild, over-the-counter mouth rinse as directed by your health care provider.  Practice good oral hygiene.  Floss your teeth every day.  Brush your teeth with a soft brush twice each day. Contact a health care provider if:  Your symptoms do not get better after two weeks.  You also have a fever or swollen glands.  You get canker sores often.  You have a canker sore that is getting larger.  You cannot eat or drink due to your canker sores. This information is not intended to replace advice given  to you by your health care provider. Make sure you discuss any questions you have with your health care provider. Document Released: 04/01/2011 Document Revised: 05/12/2016 Document Reviewed: 11/05/2014 Elsevier Interactive Patient Education  2017 [employer]

## 2017-03-21 ENCOUNTER — Other Ambulatory Visit: Payer: BC Managed Care – PPO

## 2017-03-22 ENCOUNTER — Ambulatory Visit
Admission: RE | Admit: 2017-03-22 | Discharge: 2017-03-22 | Disposition: A | Discharge status: Home / Self Care | Payer: BC Managed Care – PPO | Source: Ambulatory Visit | Attending: Family Medicine | Admitting: Family Medicine

## 2017-03-22 DIAGNOSIS — R102 Pelvic and perineal pain unspecified side: Secondary | ICD-10-CM

## 2017-03-22 DIAGNOSIS — N939 Abnormal uterine and vaginal bleeding, unspecified: Secondary | ICD-10-CM

## 2017-03-23 ENCOUNTER — Other Ambulatory Visit: Payer: BC Managed Care – PPO

## 2017-03-31 LAB — HEMOGLOBIN A1C: HEMOGLOBIN A1C: 6.6

## 2017-04-03 ENCOUNTER — Encounter: Payer: Self-pay | Admitting: *Deleted

## 2017-04-04 ENCOUNTER — Encounter: Payer: Self-pay | Admitting: Family Medicine

## 2017-04-07 LAB — HM PAP SMEAR: HM Pap smear: NEGATIVE

## 2017-04-17 DIAGNOSIS — D259 Leiomyoma of uterus, unspecified: Secondary | ICD-10-CM | POA: Insufficient documentation

## 2017-05-11 ENCOUNTER — Encounter (HOSPITAL_BASED_OUTPATIENT_CLINIC_OR_DEPARTMENT_OTHER): Admission: RE | Payer: Self-pay | Source: Ambulatory Visit

## 2017-05-11 ENCOUNTER — Ambulatory Visit (HOSPITAL_BASED_OUTPATIENT_CLINIC_OR_DEPARTMENT_OTHER)
Admission: RE | Admit: 2017-05-11 | Payer: BC Managed Care – PPO | Source: Ambulatory Visit | Admitting: Obstetrics and Gynecology

## 2017-05-11 ENCOUNTER — Other Ambulatory Visit: Payer: Self-pay

## 2017-05-11 SURGERY — DILATATION & CURETTAGE/HYSTEROSCOPY WITH RESECTOCOPE
Anesthesia: Choice

## 2017-07-04 ENCOUNTER — Encounter: Payer: BC Managed Care – PPO | Admitting: Family Medicine

## 2017-07-06 ENCOUNTER — Encounter: Payer: BC Managed Care – PPO | Admitting: Family Medicine

## 2017-07-12 NOTE — Progress Notes (Signed)
Chief Complaint  Patient presents with  . fasting cpe    fasting cpe, lost job and wants a1c done as she has met deductible and losing insurnace end of month    Vanessa [name] is a 51 y.o. female who presents for a complete physical.  She has the following concerns:  There were layoffs at work, and she lost her job.  She will be able to get on her husband's insurance.  She was last seen in March with vaginal spotting and cramping.  She hadn't had any bleeding since her ablation 7 years ago. She was evaluated with ultrasound: IMPRESSION: Dominant fundal fibroid measuring 5.1 cm in diameter. Small amount of fluid in the lower endometrial cavity. Normal endometrial stripe thickness. If bleeding remains unresponsive to hormonal or medical therapy, sonohysterogram should be considered for focal lesion work-up. (Ref: Radiological Reasoning: Algorithmic Workup of Abnormal Vaginal Bleeding with Endovaginal Sonography and Sonohysterography. AJR 2008; [id number]:[id number])  Simple appearing bilateral ovarian cysts. On the right the cyst measures 1.9 cm in greatest dimension. No free pelvic fluid.  She saw Dr. Leo Grosser in follow-up. Had biopsy 05/11/17: Endometrium, curettage - VERY SCANT ENDOCERVICAL EPITHELIUM. - CALCIFICATION  She hasn't had any further bleeding.  She also had flare of external hemorrhoid at that time.  That calmed down and hasn't bothered her since.  Diabetes and Hyperlipidemia:  Managed by Dr. Chalmers Cater.  labs 03/24/17: chol 116, HDL 38, LDL 72, TG 31, ratio 1.9; A1c 6.6, Vit D 59, nl c-met (glu 132), nl CBC Hg 13.3 Last diabetic eye exam: a year ago, due now. She checks her feet regularly without concerns.  She has been on vacation.  Thinks her sugar will be worse. She walked a lot on vacation, but admits she didn't eat that well. She would like her A1c checked again today.  H/o Vitamin D deficiency--adequately replaced; last checked 03/2017, and was 59 per Dr. Almetta Lovely labs.  R  shoulder pain--She has been seeing ortho. In 02/2017 she had  MR arthrogram; degen changes C4-5, C5-6 and adhesive capsulitis (Dr. Mardelle Matte). She was treated with oral course of steroids and improved. She hasn't had any problems since then, but she hasn't played any softball.  This starts up next week.   Immunization History  Administered Date(s) Administered  . Influenza Split 10/21/2009, 10/12/2011, 09/05/2012, 09/04/2014  . Influenza,inj,Quad PF,36+ Mos 08/27/2015, 08/31/2016  . Pneumococcal Polysaccharide-23 07/20/2007  . Td 06/30/2016  . Tdap 06/18/2006   Last Pap smear: 06/2015 normal (candida seen); no high risk HPV detected. She had recent pap by Dr. Leo Grosser (April or May 2018), no results. Last mammogram: 01/2017 Last colonoscopy: 05/2010 Dr. Collene Mares. Repeat exam recommended age 77. Last DEXA: never Dentist: twice yearly Ophtho: yearly with [facility], due now Exercise: softball twice a week--hasn't started yet, She has been walking with her neighbor 40 minutes 3-4 days/week. She gets 10K steps at least 3-4x/week. No weight-bearing exercise  Past Medical History:  Diagnosis Date  . Acne 07/2009   s/p Accutane (derm in [place])  . Allergy   . Anemia 11/09   iron deficient--resolved after ablation  . Carpal tunnel syndrome, bilateral    h/o  . Diabetes mellitus   . Dysphagia    s/p esophageal dilatation 04/2016  . Fibrocystic breast 11/2009   biopsy R breast   . H/O menorrhagia   . Hyperlipidemia   . Trochanteric bursitis 10/09   right  . Vitamin D deficiency     Past Surgical History:  Procedure  Laterality Date  . APPENDECTOMY  1988  . CARPAL TUNNEL RELEASE  R 12/08, L 2003   bilateral, Dr. Amanda Pea  . CESAREAN SECTION  2002, 2005   x 2  . COLONOSCOPY  2011  . ENDOMETRIAL ABLATION  09/2010   Dr. Pennie Rushing  . TUBAL LIGATION  2005    Social History   Social History  . Marital status: Married    Spouse name: N/A  . Number of children: 2  . Years of education: N/A    Occupational History  . 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 Smoking status: Never Smoker  . Smokeless tobacco: Never Used  . Alcohol use Yes     Comment: maybe one drink per year.  . Drug use: No  . Sexual activity: Yes    Partners: Male    Birth control/ protection: Surgical   Other Topics Concern  . Not on file   Social History Narrative   Lives with husband, son, daughter.   Lost job 06/2017    Family History  Problem Relation Age of Onset  . Heart disease Father        MI in 64's  . Diabetes Father   . Hypertension Brother   . Hypercholesterolemia Brother   . Diabetes Maternal Grandmother   . Diabetes Maternal Grandfather   . Prostate cancer Paternal Grandfather   . Colon cancer Neg Hx   . Esophageal cancer Neg Hx   . Pancreatic cancer Neg Hx   . Rectal cancer Neg Hx   . Stomach cancer Neg Hx   . Breast cancer Neg Hx     Outpatient Encounter Prescriptions as of 07/13/2017  Medication Sig Note  . cholecalciferol (VITAMIN D) 1000 UNITS tablet Take 2,000 Units by mouth daily. Reported on 02/24/2016   . JANUMET XR 50-1000 MG TB24 Take 1 tablet by mouth 2 (two) times daily with a meal. 02/24/2016: Received from: External Pharmacy  . ONE TOUCH ULTRA TEST test strip  02/02/2015: Received from: External Pharmacy  . Probiotic Product (PROBIOTIC DAILY PO) Take 1 capsule by mouth daily.   . simvastatin (ZOCOR) 20 MG tablet Take 20 mg by mouth every evening.   . [DISCONTINUED] amoxicillin (AMOXIL) 875 MG tablet Take 1 tablet (875 mg total) by mouth 2 (two) times daily.   . [DISCONTINUED] hydrocortisone (ANUSOL-HC) 2.5 % rectal cream Place 1 application rectally 2 (two) times daily.    No facility-administered encounter medications on file as of 07/13/2017.     Allergies  Allergen Reactions  . Minocycline Rash    ROS: The patient denies anorexia, fever, weight changes, headaches,  vision changes, decreased hearing, ear pain, sore throat, breast concerns, chest pain, palpitations, dizziness, syncope, dyspnea on exertion, cough, swelling, nausea, vomiting, diarrhea, constipation, abdominal pain, melena, hematochezia, indigestion/heartburn, hematuria, incontinence, dysuria, vaginal discharge, odor or itch, genital lesions, joint pains, numbness, tingling, weakness, tremor, suspicious skin lesions, depression, anxiety, abnormal bleeding/bruising, or enlarged lymph nodes.  R shoulder pain resolved after steroid course. No further dysphagia since esophageal dilation in 02/2016. No further abnormal vaginal bleeding   PHYSICAL EXAM:  BP 118/80   Pulse 69   Ht 5' 3.5" (1.613 m)   Wt 151 lb 9.6 oz (68.8 kg)   BMI 26.43 kg/m    Wt Readings from Last 3 Encounters:  07/13/17 151 lb 9.6 oz (68.8 kg)  03/15/17 151 lb 6.4 oz (68.7 kg)  03/07/17  152 lb (68.9 kg)     General Appearance:   Alert, cooperative, no distress, appears stated age  Head:   Normocephalic, without obvious abnormality, atraumatic  Eyes:   PERRL, conjunctiva/corneas clear, EOM's intact, fundi   benign  Ears:   Normal TM's and external ear canals  Nose:  Nares normal, mucosa mild-mod edematous, no erythema, no drainage or sinus tenderness  Throat:  Lips, mucosa, and tongue normal; teeth and gums normal.   Neck:  Supple, no lymphadenopathy; thyroid: no enlargement/tenderness/nodules; no carotid bruit or JVD  Back:  Spine nontender, no curvature, ROM normal, no CVA tenderness.   Lungs:   Clear to auscultation bilaterally without wheezes, rales or ronchi; respirations unlabored  Chest Wall:   No tenderness or deformity  Heart:   Regular rate and rhythm, S1 and S2 normal, no murmur, rub or gallop  Breast Exam:   Deferred to GYN.  Abdomen:   Soft, non-tender, nondistended, normoactive bowel sounds, no masses, no hepatosplenomegaly. Low  transverse well healed incision.  Genitalia:  Deferred to GYN  Rectal:  not performed  Extremities:  No clubbing, cyanosis or edema  Pulses:  2+ and symmetric all extremities  Skin:  Skin color, texture, turgor normal, no rashes or lesions  Lymph nodes:  Cervical, supraclavicular, and axillary nodes normal  Neurologic:  CNII-XII intact, normal strength, sensation and gait; reflexes 2+ and symmetric throughout   Psych: Normal mood, affect, hygiene and grooming             Normal diabetic foot exam  Lab Results  Component Value Date   HGBA1C 6.9% 07/13/2017    ASSESSMENT/PLAN:  Annual physical exam - Plan: POCT Urinalysis DIP (Proadvantage Device)  Pure hypercholesterolemia - at goal per labs by Dr. Chalmers Cater. Continue statin  Type 2 diabetes mellitus without complication, without long-term current use of insulin (HCC) - A1c slightly higher--still <7. Her diet should improve now that vacation is over. Continue proper diet, regular exercise - Plan: HgB A1c, Microalbumin / creatinine urine ratio  Vitamin D deficiency - adequately replaced on current supplements   Shingrix age 76 when available   Discussed monthly self breast exams and yearly mammograms; at least 30 minutes of aerobic activity at least 5 days/week, weight-bearing exercise at least 2x/week; proper sunscreen use reviewed; healthy diet, including goals of calcium and vitamin D intake and alcohol recommendations (less than or equal to 1 drink/day) reviewed; regular seatbelt use; changing batteries in smoke detectors. Immunization recommendations discussed--flu shots yearly in the fall. Prevnar age 2 (next year). Shingrix age 58. Colonoscopy recommendations reviewed--UTD. Repeat recommended age 69 per Dr. Collene Mares.  F/u 1 year, sooner prn. Continue regular f/u with Dr. Chalmers Cater.

## 2017-07-13 ENCOUNTER — Encounter: Payer: Self-pay | Admitting: Family Medicine

## 2017-07-13 ENCOUNTER — Ambulatory Visit (INDEPENDENT_AMBULATORY_CARE_PROVIDER_SITE_OTHER): Payer: BC Managed Care – PPO | Admitting: Family Medicine

## 2017-07-13 VITALS — BP 118/80 | HR 69 | Ht 63.5 in | Wt 151.6 lb

## 2017-07-13 DIAGNOSIS — E119 Type 2 diabetes mellitus without complications: Secondary | ICD-10-CM | POA: Diagnosis not present

## 2017-07-13 DIAGNOSIS — E559 Vitamin D deficiency, unspecified: Secondary | ICD-10-CM | POA: Diagnosis not present

## 2017-07-13 DIAGNOSIS — E7800 Pure hypercholesterolemia, unspecified: Secondary | ICD-10-CM

## 2017-07-13 DIAGNOSIS — Z0000 Encounter for general adult medical examination without abnormal findings: Secondary | ICD-10-CM | POA: Diagnosis not present

## 2017-07-13 LAB — POCT GLYCOSYLATED HEMOGLOBIN (HGB A1C)

## 2017-07-13 LAB — POCT URINALYSIS DIP (PROADVANTAGE DEVICE)
BILIRUBIN UA: NEGATIVE
Blood, UA: NEGATIVE
Glucose, UA: NEGATIVE mg/dL
Ketones, POC UA: NEGATIVE mg/dL
LEUKOCYTES UA: NEGATIVE
NITRITE UA: NEGATIVE
PROTEIN UA: NEGATIVE mg/dL
Specific Gravity, Urine: 1.03
Urobilinogen, Ur: NEGATIVE
pH, UA: 6 (ref 5.0–8.0)

## 2017-07-13 NOTE — Patient Instructions (Signed)
  HEALTH MAINTENANCE RECOMMENDATIONS:  It is recommended that you get at least 30 minutes of aerobic exercise at least 5 days/week (for weight loss, you may need as much as 60-90 minutes). This can be any activity that gets your heart rate up. This can be divided in 10-15 minute intervals if needed, but try and build up your endurance at least once a week.  Weight bearing exercise is also recommended twice weekly.  Eat a healthy diet with lots of vegetables, fruits and fiber.  "Colorful" foods have a lot of vitamins (ie green vegetables, tomatoes, red peppers, etc).  Limit sweet tea, regular sodas and alcoholic beverages, all of which has a lot of calories and sugar.  Up to 1 alcoholic drink daily may be beneficial for women (unless trying to lose weight, watch sugars).  Drink a lot of water.  Calcium recommendations are 1200-1500 mg daily (1500 mg for postmenopausal women or women without ovaries), and vitamin D 1000 IU daily.  This should be obtained from diet and/or supplements (vitamins), and calcium should not be taken all at once, but in divided doses.  Monthly self breast exams and yearly mammograms for women over the age of 50 is recommended.  Sunscreen of at least SPF 30 should be used on all sun-exposed parts of the skin when outside between the hours of 10 am and 4 pm (not just when at beach or pool, but even with exercise, golf, tennis, and yard work!)  Use a sunscreen that says "broad spectrum" so it covers both UVA and UVB rays, and make sure to reapply every 1-2 hours.  Remember to change the batteries in your smoke detectors when changing your clock times in the spring and fall.  Use your seat belt every time you are in a car, and please drive safely and not be distracted with cell phones and texting while driving.  Schedule your yearly diabetic eye exam.  Your colonoscopy will be due again at age 51 per Dr. Kenna GilbertMann's last recommendation.  I recommend getting the new shingles vaccine  (Shingrix). You will need to check with your insurance to see if it is covered.  It is not currently readily available (manufacturer's backorder), so hold off on starting this until you see commercials on TV again (or in 2019).  It is a series of 2 injections, spaced 2 months apart. You can call to schedule a nurse visit vs waiting until your next physical.  I likely will also give you a Prevnar-13 (different type of pneumonia vaccine) at your next physical.

## 2017-07-14 LAB — MICROALBUMIN / CREATININE URINE RATIO
Creatinine, Urine: 97 mg/dL (ref 20–320)
MICROALB UR: 0.4 mg/dL
Microalb Creat Ratio: 4 mcg/mg creat (ref <30)

## 2017-08-05 IMAGING — US US PELVIS COMPLETE
1 series · 13 of 25 positions shown · non-contrast
Comparison: Abdominal and pelvic CT scan dated August 17, 2010

CLINICAL DATA: Vaginal bleeding.  Right adnexal pain and fullness.

EXAM:
TRANSABDOMINAL AND TRANSVAGINAL ULTRASOUND OF PELVIS
TECHNIQUE: Both transabdominal and transvaginal ultrasound examinations of the
pelvis were performed. Transabdominal technique was performed for
global imaging of the pelvis including uterus, ovaries, adnexal
regions, and pelvic cul-de-sac. It was necessary to proceed with
endovaginal exam following the transabdominal exam to visualize the
uterus, endometrium, ovaries, and adnexal structures..

[Series 1: us pelvis complete · 0.19mm/px · 13 of 82 slices shown]
[im 1/82]
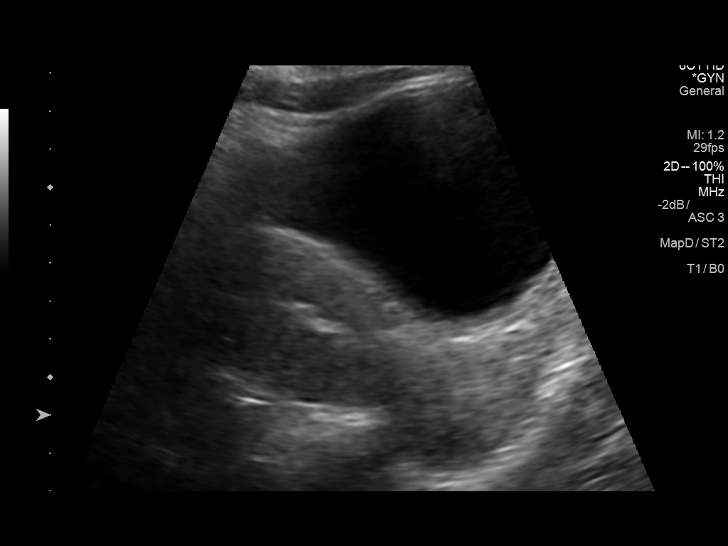
[im 7/82]
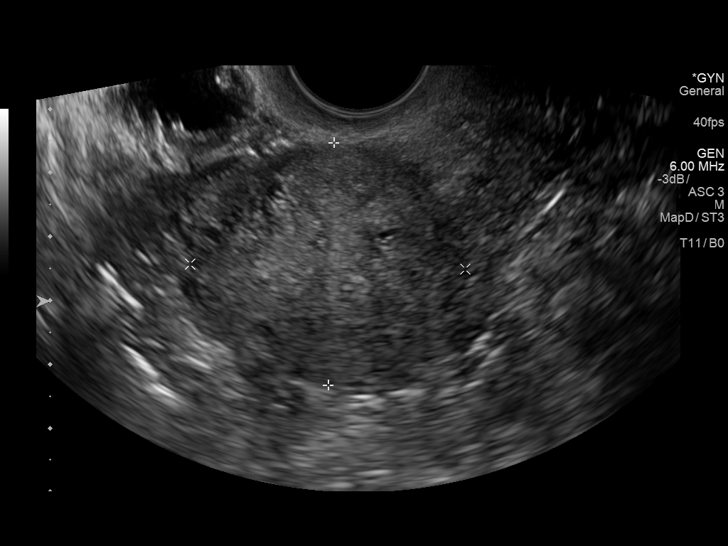
[im 14/82]
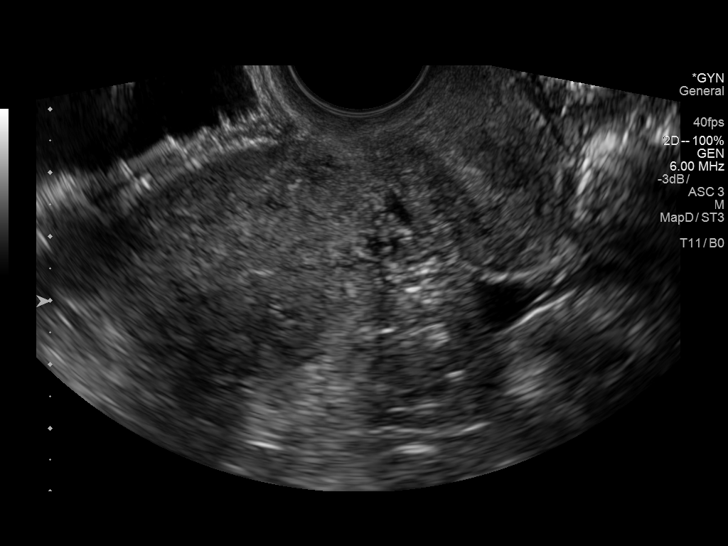
[im 21/82]
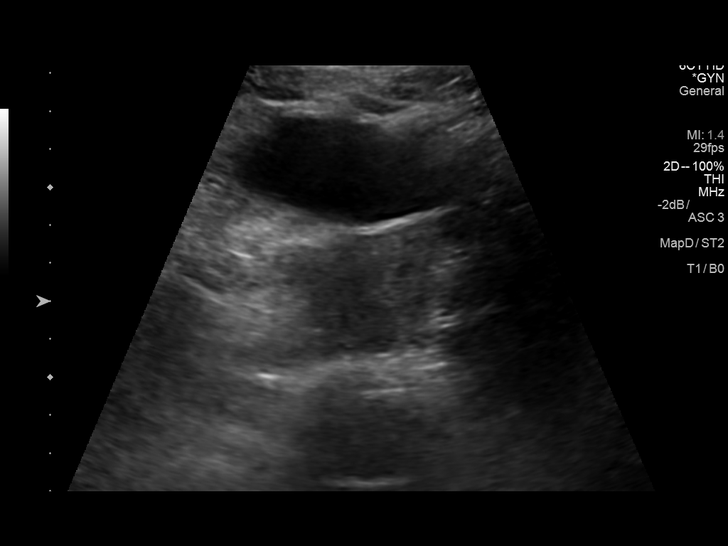
[im 28/82]
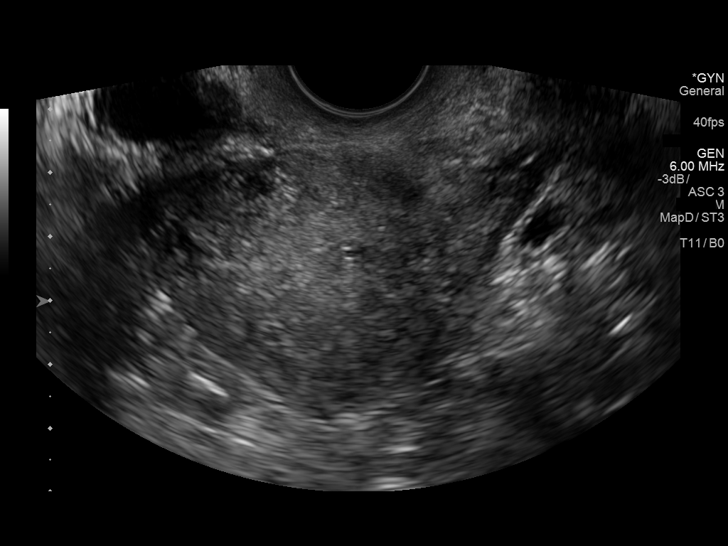
[im 34/82]
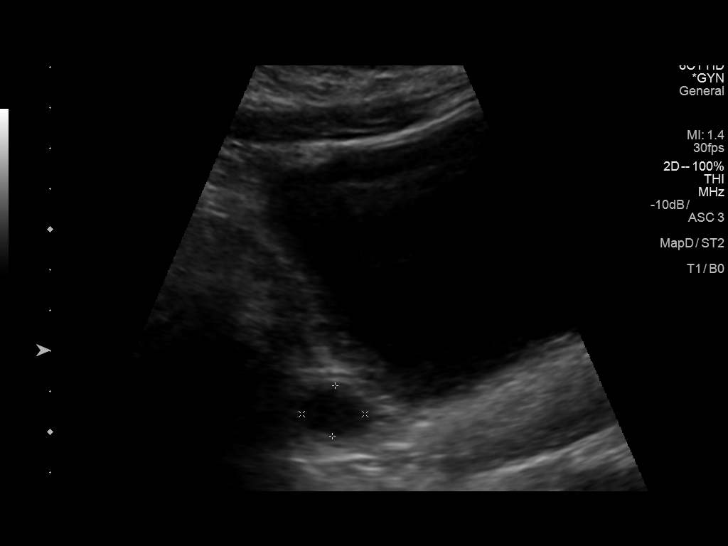
[im 41/82]
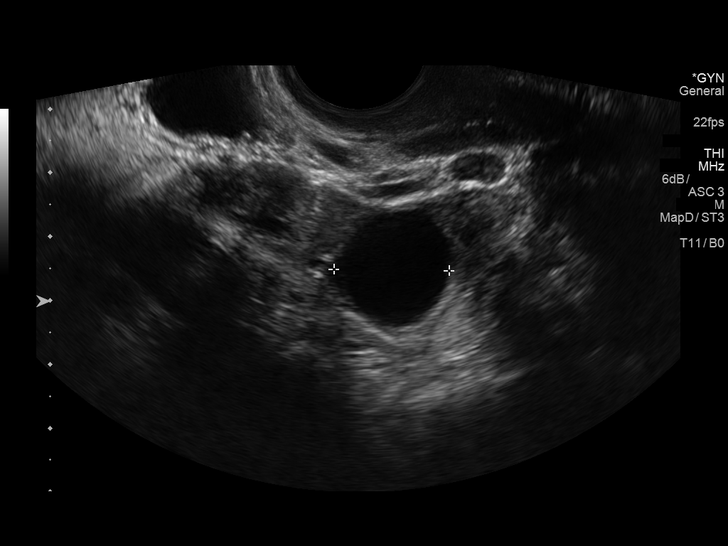
[im 48/82]
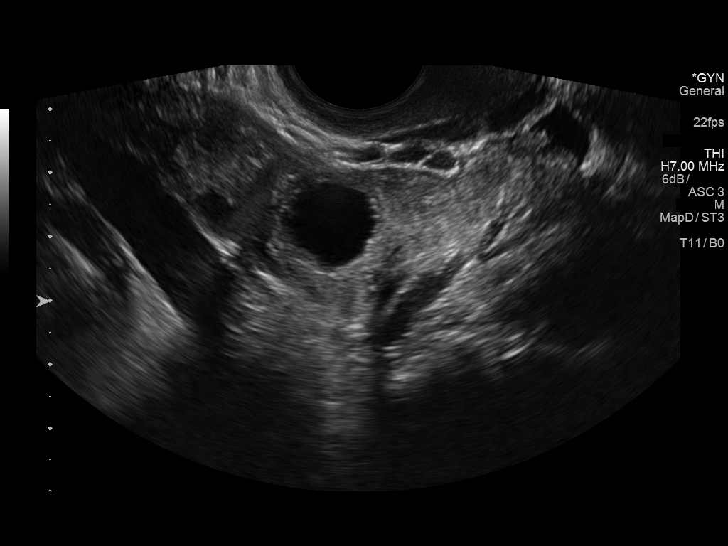
[im 55/82]
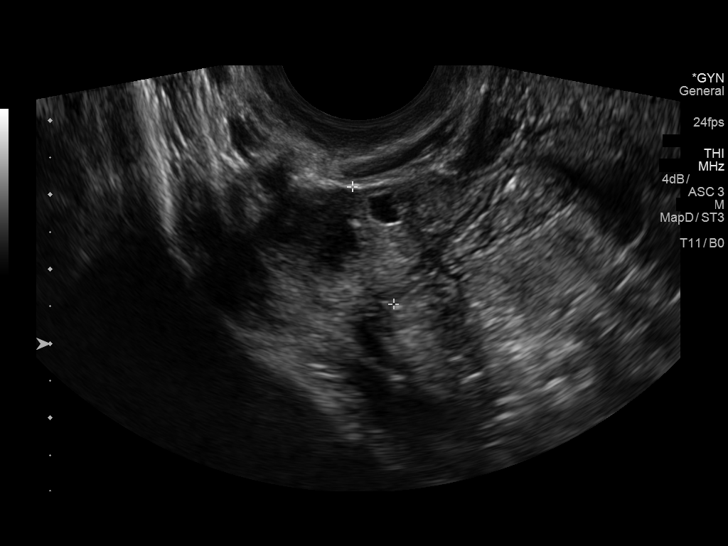
[im 61/82]
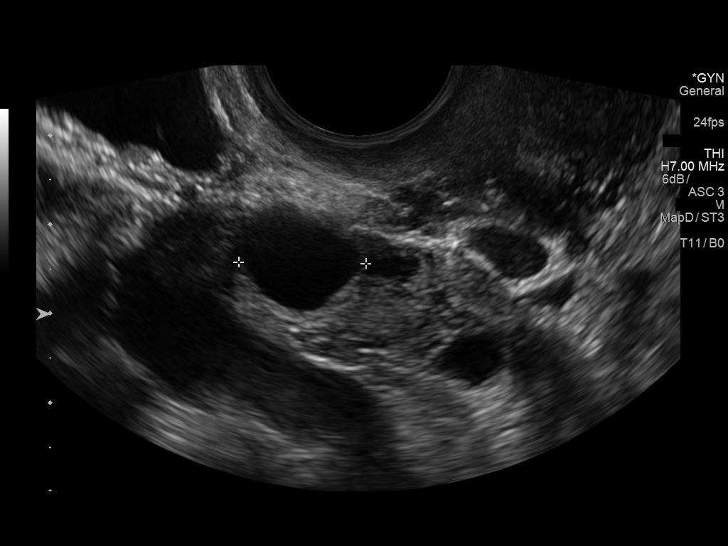
[im 68/82]
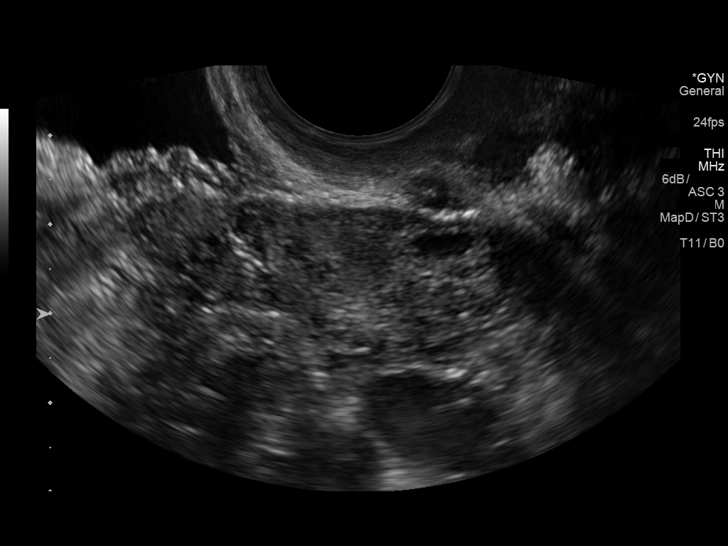
[im 75/82]
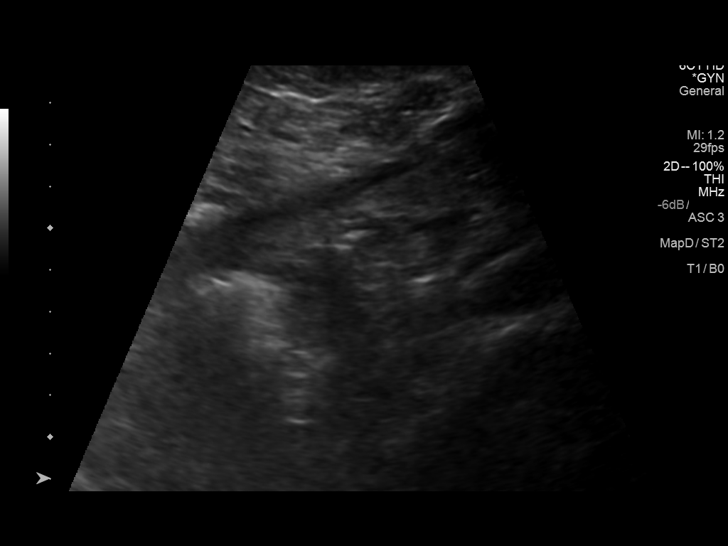
[im 82/82]
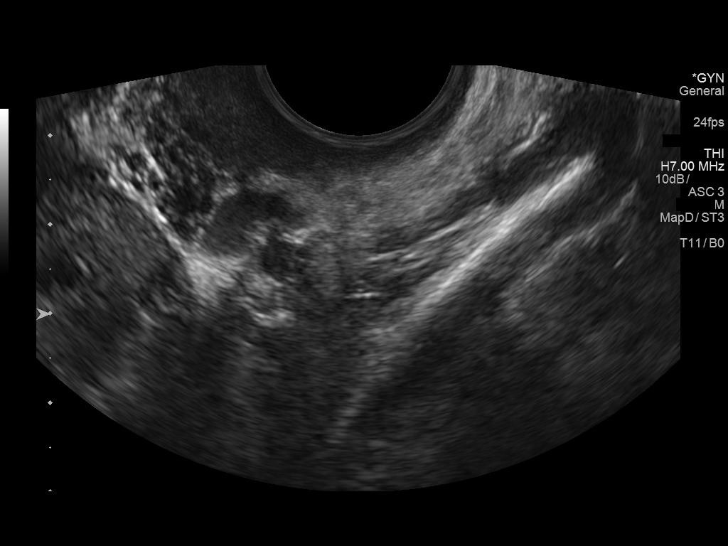

[13 of 25 positions shown; findings below may reference images not displayed]

FINDINGS: Uterus

Measurements: 9.7 x 3.9 x 4.7 cm. There is a dominant fundal fibroid
measuring 5.1 x 3.8 x 4.3 cm

Endometrium

Thickness: 2.5 mm. No endometrial mass is observed. There is a small
amount of fluid in the lower uterine segment.

Right ovary

Measurements: 4.6 x 2.1 x 1.7 cm. There is a simple appearing right
ovarian cyst measuring 1.8 x 1.9 x 1.6 cm

Left ovary

Measurements: 4.3 x 1.8 x 1.7 cm. There is a simple appearing cyst
measuring 1.4 x 1.0 x 1.0 cm.

Other findings

No abnormal free fluid.
IMPRESSION: Dominant fundal fibroid measuring 5.1 cm in diameter. Small amount
of fluid in the lower endometrial cavity. Normal endometrial stripe
thickness. If bleeding remains unresponsive to hormonal or medical
therapy, sonohysterogram should be considered for focal lesion
work-up. (Ref: Radiological Reasoning: Algorithmic Workup of
Abnormal Vaginal Bleeding with Endovaginal Sonography and
Sonohysterography. AJR 0335; [id number])

Simple appearing bilateral ovarian cysts. On the right the cyst
measures 1.9 cm in greatest dimension. No free pelvic fluid.

## 2017-08-14 LAB — HM DIABETES EYE EXAM

## 2017-08-31 ENCOUNTER — Encounter: Payer: Self-pay | Admitting: *Deleted

## 2017-10-09 ENCOUNTER — Telehealth: Payer: Self-pay | Admitting: Family Medicine

## 2017-10-09 NOTE — Telephone Encounter (Signed)
Patient will call Dr.Mann.

## 2017-10-09 NOTE — Telephone Encounter (Signed)
She has seen Dr. Loreta AveMann in the past for colonoscopy.  Has she tried calling them directly, or does she need referral?  If no referral needed, she can call them. Doesn't need referral as she is an established patient, due for colonoscopy at age 51.

## 2017-10-09 NOTE — Telephone Encounter (Signed)
Is this okay for me to set up with Dr.Mann?

## 2017-10-09 NOTE — Telephone Encounter (Signed)
Pt called and stated she would like to be set up for a colonoscopy. She states it was discussed at her CPE. She would like to have done by the end of the year as she has met her out of pocket for the year.  °

## 2017-10-23 ENCOUNTER — Ambulatory Visit: Payer: BC Managed Care – PPO | Admitting: Family Medicine

## 2017-11-01 ENCOUNTER — Ambulatory Visit (INDEPENDENT_AMBULATORY_CARE_PROVIDER_SITE_OTHER): Payer: BLUE CROSS/BLUE SHIELD | Admitting: Family Medicine

## 2017-11-01 ENCOUNTER — Encounter: Payer: Self-pay | Admitting: Family Medicine

## 2017-11-01 VITALS — BP 112/72 | HR 80 | Ht 63.5 in | Wt 154.4 lb

## 2017-11-01 DIAGNOSIS — N644 Mastodynia: Secondary | ICD-10-CM

## 2017-11-01 DIAGNOSIS — N6310 Unspecified lump in the right breast, unspecified quadrant: Secondary | ICD-10-CM | POA: Diagnosis not present

## 2017-11-01 DIAGNOSIS — M940 Chondrocostal junction syndrome [Tietze]: Secondary | ICD-10-CM | POA: Diagnosis not present

## 2017-11-01 NOTE — Progress Notes (Signed)
Chief Complaint  Patient presents with  . Cyst    on right chest are x 1 month.     Painful area on the right side of the chest (upper breast area) for about a month. She feels like there may be some swelling in the underarm as well. Denies change in activity, hasn't been playing softball. Denies change in deoderant.  She no longer gets periods due to ablation. She still gets periods of breast soreness, but they aren't sore now. Last mammogram was in February and it was fine.   She has had a similar swollen bump at the right medial chest for a while.  She took anti-inflammatories and it resolved.  She continues to have a tender area in that spot.  No nipple drainage, discharge, breast lump  Thinks she got flu shot at Dr. Willeen CassBalan's office recently (10/12).  PMH, PSH, SH reviewed  Outpatient Encounter Medications as of 11/01/2017  Medication Sig Note  . cholecalciferol (VITAMIN D) 1000 UNITS tablet Take 2,000 Units by mouth daily. Reported on 02/24/2016   . JANUMET XR 50-1000 MG TB24 Take 1 tablet by mouth 2 (two) times daily with a meal. 02/24/2016: Received from: External Pharmacy  . ONE TOUCH ULTRA TEST test strip  02/02/2015: Received from: External Pharmacy  . Probiotic Product (PROBIOTIC DAILY PO) Take 1 capsule by mouth daily.   . simvastatin (ZOCOR) 20 MG tablet Take 20 mg by mouth every evening.    No facility-administered encounter medications on file as of 11/01/2017.    Allergies  Allergen Reactions  . Minocycline Rash    ROS: no fever, chills, headaches, dizziness, exertional chest pain, GI complaints. Moods are good. See HPI  PHYSICAL EXAM:  BP 112/72   Pulse 80   Ht 5' 3.5" (1.613 m)   Wt 154 lb 6.4 oz (70 kg)   BMI 26.92 kg/m   Pleasant female, in no distress  Chest:  Mild tenderness to palpation along costochondral junctions on the right at multiple levels. No masses. Fibroglandular feeling right breast, though nontender at the FG area in the UOQ. She has a  focal area of tenderness in the 12-1 o'clock position at the superior aspect of the right breast, with some focal fibroglandular feeling tissue/mass. It is mildly tender.  Overlying skin is normal.  Axilla is normal, nontender, no adenopathy. No nipple drainage, skin dimpling or masses palpable elsewhere within the breast  ASSESSMENT/PLAN:  Painful lumpy right breast - check diagnostic mammo and US - Plan: MM DIAG BREAST TOMO UNI RIGHT, US BREAST LTD UNI RIGHT INC AXILLA, CANCELED: MM Digital Diagnostic Unilat R, CANCELED: US BREAST COMPLETE UNI RIGHT INC AXILLA  Costochondritis - warm compresses, NSAID (has meloxicam at home, can restart)    We are sending you to get mammogram and ultrasound of the right breast to evaluate the lump and pain you are experiencing.  I think you also have recurrence of the costochondritis--inflammation of the cartilage where the ribs meet the breast bone.  Warm compresses and anti-inflammatories should help resolve this.

## 2017-11-01 NOTE — Patient Instructions (Signed)
We are sending you to get mammogram and ultrasound of the right breast to evaluate the lump and pain you are experiencing.  I think you also have recurrence of the costochondritis--inflammation of the cartilage where the ribs meet the breast bone.  Warm compresses and anti-inflammatories should help resolve this. If you still have meloxicam at home, take this once daily for up to 2 weeks (you can stop sooner if completely better).   Costochondritis Costochondritis is swelling and irritation (inflammation) of the tissue (cartilage) that connects your ribs to your breastbone (sternum). This causes pain in the front of your chest. The pain usually starts gradually and involves more than one rib. What are the causes? The exact cause of this condition is not always known. It results from stress on the cartilage where your ribs attach to your sternum. The cause of this stress could be:  Chest injury (trauma).  Exercise or activity, such as lifting.  Severe coughing.  What increases the risk? You may be at higher risk for this condition if you:  Are female.  Are 7830?50 years old.  Recently started a new exercise or work activity.  Have low levels of vitamin D.  Have a condition that makes you cough frequently.  What are the signs or symptoms? The main symptom of this condition is chest pain. The pain:  Usually starts gradually and can be sharp or dull.  Gets worse with deep breathing, coughing, or exercise.  Gets better with rest.  May be worse when you press on the sternum-rib connection (tenderness).  How is this diagnosed? This condition is diagnosed based on your symptoms, medical history, and a physical exam. Your health care provider will check for tenderness when pressing on your sternum. This is the most important finding. You may also have tests to rule out other causes of chest pain. These may include:  A chest X-ray to check for lung problems.  An  electrocardiogram (ECG) to see if you have a heart problem that could be causing the pain.  An imaging scan to rule out a chest or rib fracture.  How is this treated? This condition usually goes away on its own over time. Your health care provider may prescribe an NSAID to reduce pain and inflammation. Your health care provider may also suggest that you:  Rest and avoid activities that make pain worse.  Apply heat or cold to the area to reduce pain and inflammation.  Do exercises to stretch your chest muscles.  If these treatments do not help, your health care provider may inject a numbing medicine at the sternum-rib connection to help relieve the pain. Follow these instructions at home:  Avoid activities that make pain worse. This includes any activities that use chest, abdominal, and side muscles.  If directed, put ice on the painful area: ? Put ice in a plastic bag. ? Place a towel between your skin and the bag. ? Leave the ice on for 20 minutes, 2-3 times a day.  If directed, apply heat to the affected area as often as told by your health care provider. Use the heat source that your health care provider recommends, such as a moist heat pack or a heating pad. ? Place a towel between your skin and the heat source. ? Leave the heat on for 20-30 minutes. ? Remove the heat if your skin turns bright red. This is especially important if you are unable to feel pain, heat, or cold. You may have a  greater risk of getting burned.  Take over-the-counter and prescription medicines only as told by your health care provider.  Return to your normal activities as told by your health care provider. Ask your health care provider what activities are safe for you.  Keep all follow-up visits as told by your health care provider. This is important. Contact a health care provider if:  You have chills or a fever.  Your pain does not go away or it gets worse.  You have a cough that does not go away  (is persistent). Get help right away if:  You have shortness of breath. This information is not intended to replace advice given to you by your health care provider. Make sure you discuss any questions you have with your health care provider. Document Released: 09/14/2005 Document Revised: 06/24/2016 Document Reviewed: 03/30/2016 Elsevier Interactive Patient Education  [employer]2018 [employer]

## 2017-11-08 ENCOUNTER — Other Ambulatory Visit: Payer: BC Managed Care – PPO

## 2017-11-08 ENCOUNTER — Ambulatory Visit
Admission: RE | Admit: 2017-11-08 | Discharge: 2017-11-08 | Disposition: A | Discharge status: Home / Self Care | Payer: BLUE CROSS/BLUE SHIELD | Source: Ambulatory Visit | Attending: Family Medicine | Admitting: Family Medicine

## 2017-11-08 ENCOUNTER — Ambulatory Visit
Admission: RE | Admit: 2017-11-08 | Discharge: 2017-11-08 | Disposition: A | Discharge status: Home / Self Care | Payer: BC Managed Care – PPO | Source: Ambulatory Visit | Attending: Family Medicine | Admitting: Family Medicine

## 2017-11-08 DIAGNOSIS — N644 Mastodynia: Principal | ICD-10-CM

## 2017-11-08 DIAGNOSIS — N6310 Unspecified lump in the right breast, unspecified quadrant: Secondary | ICD-10-CM

## 2017-11-17 LAB — HM COLONOSCOPY

## 2017-11-23 ENCOUNTER — Encounter: Payer: Self-pay | Admitting: Family Medicine

## 2018-02-12 ENCOUNTER — Encounter: Payer: BC Managed Care – PPO | Admitting: Family Medicine

## 2018-03-30 LAB — HEMOGLOBIN A1C: Hemoglobin A1C: 6.5

## 2018-04-02 ENCOUNTER — Encounter: Payer: Self-pay | Admitting: Family Medicine

## 2018-04-02 ENCOUNTER — Other Ambulatory Visit: Payer: Self-pay | Admitting: *Deleted

## 2018-04-02 ENCOUNTER — Telehealth: Payer: Self-pay | Admitting: Family Medicine

## 2018-04-02 ENCOUNTER — Other Ambulatory Visit: Payer: Self-pay | Admitting: Family Medicine

## 2018-04-02 ENCOUNTER — Ambulatory Visit (INDEPENDENT_AMBULATORY_CARE_PROVIDER_SITE_OTHER): Payer: 59 | Admitting: Family Medicine

## 2018-04-02 VITALS — BP 110/74 | HR 68 | Temp 97.9°F | Ht 64.5 in | Wt 154.8 lb

## 2018-04-02 DIAGNOSIS — Z139 Encounter for screening, unspecified: Secondary | ICD-10-CM

## 2018-04-02 DIAGNOSIS — N644 Mastodynia: Secondary | ICD-10-CM

## 2018-04-02 DIAGNOSIS — M940 Chondrocostal junction syndrome [Tietze]: Secondary | ICD-10-CM

## 2018-04-02 NOTE — Telephone Encounter (Signed)
Pt called stating that she has an appt in just a little bit (at 3:00) with Dr. Lynelle DoctorKnapp however she [facility] but they told her that she need a diagnostic order so pt wants to know if she can get an order from Dr Lynelle DoctorKnapp and not have come in for her appt. Wants to know this before her 3:00 appt and [facility]

## 2018-04-02 NOTE — Progress Notes (Signed)
Chief Complaint  Patient presents with  . Breast Pain    left breast pain since Friday night.    She is complaining of pain under the left breast x 3 days.  Denies any trauma. Doesn't feel like the same sort of discomfort as related to cycles/hormones. The breast feels full, like a gland is full/swollen. Feels better when she lays down. When standing/seated, feels a pain into the tip of the nipple. No radiation into the armpit. She does have some discomfort in the center of her chest, that has been more chronic (costochondritis). She denies any nipple discharge, or other breast concerns.   She had US and mammo of R breast 11/21 when having tenderness. She is due for her bilateral mammo. Tried to schedule herself, but due to having pain, was asked to see PCP for orders  PMH, PSH, SH reviewed  Outpatient Encounter Medications as of 04/02/2018  Medication Sig Note  . cholecalciferol (VITAMIN D) 1000 UNITS tablet Take 2,000 Units by mouth daily. Reported on 02/24/2016   . JANUMET XR 50-1000 MG TB24 Take 1 tablet by mouth 2 (two) times daily with a meal. 02/24/2016: Received from: External Pharmacy  . ONE TOUCH ULTRA TEST test strip  02/02/2015: Received from: External Pharmacy  . Probiotic Product (PROBIOTIC DAILY PO) Take 1 capsule by mouth daily.   . simvastatin (ZOCOR) 20 MG tablet Take 20 mg by mouth every evening.    No facility-administered encounter medications on file as of 04/02/2018.    Allergies  Allergen Reactions  . Minocycline Rash   ROS: no fever, chills, URI symptoms, exertional chest pain, shortness of breath. See HPI  PHYSICAL EXAM:  BP 110/74   Pulse 68   Temp 97.9 F (36.6 C) (Tympanic)   Ht 5' 4.5" (1.638 m)   Wt 154 lb 12.8 oz (70.2 kg)   BMI 26.16 kg/m   Well appearing, pleasant female, in mild-mod discomfort due to left breast pain Chest wall: Tender L>R costochondral junctions; no erythema, warmth, swelling Breasts: normal exam on the right. Left  breast--Tender at inferior portion of left breast, 4-6 o'clock position, with some focal fibrocystic/fibroglandular, very tender areas No axillary lymphadenopathy, rest of breast is normal. No nipple changes, drainage  ASSESSMENT/PLAN:  Breast pain, left - check mammo, US (due for bilateral mammo; US just if needed on left). Avoid underwire bra, try sports bra. limit caffeine - Plan: MM DIAG BREAST TOMO BILATERAL, CANCELED: MM Digital Diagnostic Bilat  Costochondritis - continue heat, NSAIDs prn   She is due for bilateral mammograms, (had R side done in November, due for yearly now). Schedule bilateral diagnostic mammograms, with possible US on the left.

## 2018-04-02 NOTE — Telephone Encounter (Signed)
Spoke with patient and she will be here at 2:45 for her 3:00pm appt.

## 2018-04-03 ENCOUNTER — Encounter: Payer: Self-pay | Admitting: Family Medicine

## 2018-04-05 ENCOUNTER — Ambulatory Visit: Admission: RE | Admit: 2018-04-05 | Payer: BLUE CROSS/BLUE SHIELD | Source: Ambulatory Visit

## 2018-04-05 ENCOUNTER — Other Ambulatory Visit: Payer: BLUE CROSS/BLUE SHIELD

## 2018-04-05 ENCOUNTER — Ambulatory Visit
Admission: RE | Admit: 2018-04-05 | Discharge: 2018-04-05 | Disposition: A | Discharge status: Home / Self Care | Payer: BLUE CROSS/BLUE SHIELD | Source: Ambulatory Visit | Attending: Family Medicine | Admitting: Family Medicine

## 2018-04-05 DIAGNOSIS — N644 Mastodynia: Secondary | ICD-10-CM

## 2018-07-19 ENCOUNTER — Encounter: Payer: BC Managed Care – PPO | Admitting: Family Medicine

## 2018-09-28 LAB — HEMOGLOBIN A1C: Hemoglobin A1C: 6.5

## 2018-10-03 ENCOUNTER — Encounter: Payer: Self-pay | Admitting: *Deleted

## 2018-10-04 ENCOUNTER — Encounter: Payer: Self-pay | Admitting: Family Medicine

## 2018-11-18 HISTORY — PX: NEUROMA SURGERY: SHX722

## 2019-03-08 ENCOUNTER — Encounter: Payer: 59 | Admitting: Vascular Surgery

## 2019-03-08 ENCOUNTER — Encounter (HOSPITAL_COMMUNITY): Payer: 59

## 2019-04-05 LAB — LIPID PANEL
Cholesterol: 153 (ref 0–200)
HDL: 54 (ref 35–70)
LDL Cholesterol: 86
Triglycerides: 64 (ref 40–160)

## 2019-04-05 LAB — MICROALBUMIN, URINE: Microalb, Ur: 6

## 2019-04-05 LAB — HEMOGLOBIN A1C: Hemoglobin A1C: 6.1

## 2019-05-02 ENCOUNTER — Other Ambulatory Visit: Payer: Self-pay | Admitting: Family Medicine

## 2019-05-02 DIAGNOSIS — Z1231 Encounter for screening mammogram for malignant neoplasm of breast: Secondary | ICD-10-CM

## 2019-05-03 ENCOUNTER — Ambulatory Visit
Admission: RE | Admit: 2019-05-03 | Discharge: 2019-05-03 | Disposition: A | Discharge status: Home / Self Care | Payer: Managed Care, Other (non HMO) | Source: Ambulatory Visit | Attending: Family Medicine | Admitting: Family Medicine

## 2019-05-03 ENCOUNTER — Other Ambulatory Visit: Payer: Self-pay

## 2019-05-03 DIAGNOSIS — Z1231 Encounter for screening mammogram for malignant neoplasm of breast: Secondary | ICD-10-CM

## 2019-05-16 ENCOUNTER — Other Ambulatory Visit: Payer: Self-pay

## 2019-05-16 DIAGNOSIS — M79672 Pain in left foot: Secondary | ICD-10-CM

## 2019-05-16 DIAGNOSIS — M79671 Pain in right foot: Secondary | ICD-10-CM

## 2019-05-17 ENCOUNTER — Encounter: Payer: Self-pay | Admitting: Vascular Surgery

## 2019-05-17 ENCOUNTER — Ambulatory Visit (INDEPENDENT_AMBULATORY_CARE_PROVIDER_SITE_OTHER): Payer: Managed Care, Other (non HMO) | Admitting: Vascular Surgery

## 2019-05-17 ENCOUNTER — Other Ambulatory Visit: Payer: Self-pay

## 2019-05-17 ENCOUNTER — Ambulatory Visit (HOSPITAL_COMMUNITY)
Admission: RE | Admit: 2019-05-17 | Discharge: 2019-05-17 | Disposition: A | Discharge status: Home / Self Care | Payer: Managed Care, Other (non HMO) | Source: Ambulatory Visit | Attending: Vascular Surgery | Admitting: Vascular Surgery

## 2019-05-17 VITALS — BP 138/82 | HR 60 | Resp 20 | Ht 64.5 in | Wt 149.0 lb

## 2019-05-17 DIAGNOSIS — M79671 Pain in right foot: Secondary | ICD-10-CM

## 2019-05-17 DIAGNOSIS — M79672 Pain in left foot: Secondary | ICD-10-CM

## 2019-05-17 NOTE — Progress Notes (Signed)
Patient ID: Vanessa [name], female   DOB: 12-13-1966, 53 y.o.   MRN: [id number]  Reason for Consult: New Patient (Initial Visit)   Referred by Joselyn Arrow, MD  Subjective:     HPI:  Vanessa [name] is a 53 y.o. female without significant vascular history but does have diabetes takes cholesterol medicine at the discretion of her endocrinologist.  Recently underwent neuroma surgery on her right second and third toe.  States that she does have numbness to the third toe as well as skin discoloration.  She has had persistent pain in the toe as well.  She is disappointed that she has been unable to return to her full level of activity.  Patient wants to play softball when it resumes.  Currently does not have any wounds.  She walks without limitation.  Is never been a smoker.  She is worried that she has peripheral arterial disease.  Past Medical History:  Diagnosis Date  . Acne 07/2009   s/p Accutane (derm in [place])  . Allergy   . Anemia 11/09   iron deficient--resolved after ablation  . Carpal tunnel syndrome, bilateral    h/o  . Diabetes mellitus   . Dysphagia    s/p esophageal dilatation 04/2016  . Fibrocystic breast 11/2009   biopsy R breast   . H/O menorrhagia   . Hyperlipidemia   . Trochanteric bursitis 10/09   right  . Vitamin D deficiency    Family History  Problem Relation Age of Onset  . Heart disease Father        MI in 19's  . Diabetes Father   . Hypertension Brother   . Hypercholesterolemia Brother   . Diabetes Maternal Grandmother   . Diabetes Maternal Grandfather   . Prostate cancer Paternal Grandfather   . Colon cancer Neg Hx   . Esophageal cancer Neg Hx   . Pancreatic cancer Neg Hx   . Rectal cancer Neg Hx   . Stomach cancer Neg Hx   . Breast cancer Neg Hx    Past Surgical History:  Procedure Laterality Date  . APPENDECTOMY  1988  . BREAST BIOPSY Right pt unsure   benign  . CARPAL TUNNEL RELEASE  R 12/08, L 2003   bilateral, Dr.  Amanda Pea  . CESAREAN SECTION  2002, 2005   x 2  . COLONOSCOPY  2011  . ENDOMETRIAL ABLATION  09/2010   Dr. Pennie Rushing  . TUBAL LIGATION  2005    Short Social History:  Social History   Tobacco Use  . Smoking status: Never Smoker  . Smokeless tobacco: Never Used  Substance Use Topics  . Alcohol use: Yes    Comment: maybe one drink per year.    Allergies  Allergen Reactions  . Minocycline Rash    Current Outpatient Medications  Medication Sig Dispense Refill  . cholecalciferol (VITAMIN D) 1000 UNITS tablet Take 2,000 Units by mouth daily. Reported on 02/24/2016    . JANUMET XR 50-1000 MG TB24 Take 1 tablet by mouth 2 (two) times daily with a meal.  12  . ONE TOUCH ULTRA TEST test strip   4  . Probiotic Product (PROBIOTIC DAILY PO) Take 1 capsule by mouth daily.    . simvastatin (ZOCOR) 20 MG tablet Take 20 mg by mouth every evening.     No current facility-administered medications for this visit.     Review of Systems  Constitutional:  Constitutional negative. HENT: HENT negative.  Eyes: Eyes negative.  Respiratory: Respiratory negative.  Cardiovascular: Cardiovascular negative.  Musculoskeletal:       Foot pain Skin: Skin negative.  Neurological: Neurological negative. Hematologic: Hematologic/lymphatic negative.  Psychiatric: Psychiatric negative.        Objective:  Objective   Vitals:   05/17/19 1432  BP: 138/82  Pulse: 60  Resp: 20  SpO2: 98%  Weight: 149 lb (67.6 kg)  Height: 5' 4.5" (1.638 m)   Body mass index is 25.18 kg/m.  Physical Exam Constitutional:      Appearance: Normal appearance.  HENT:     Head: Normocephalic.     Mouth/Throat:     Mouth: Mucous membranes are moist.  Eyes:     Pupils: Pupils are equal, round, and reactive to light.  Cardiovascular:     Rate and Rhythm: Normal rate and regular rhythm.     Pulses:          Radial pulses are 2+ on the right side and 2+ on the left side.       Popliteal pulses are 2+ on the right  side and 2+ on the left side.       Dorsalis pedis pulses are 2+ on the right side and 2+ on the left side.       Posterior tibial pulses are 2+ on the right side and 2+ on the left side.  Pulmonary:     Effort: Pulmonary effort is normal.  Abdominal:     General: Abdomen is flat.     Palpations: There is no mass.  Musculoskeletal: Normal range of motion.        General: No swelling.  Skin:    General: Skin is warm and dry.     Capillary Refill: Capillary refill takes less than 2 seconds.     Comments: Well-healed right foot scars  Neurological:     General: No focal deficit present.     Mental Status: She is alert.  Psychiatric:        Mood and Affect: Mood normal.        Behavior: Behavior normal.        Thought Content: Thought content normal.        Judgment: Judgment normal.     Data: I have independently interpreted her bilateral ABIs which are greater than 1 and triphasic bilaterally.     Assessment/Plan:    53 year old female status post excision of neuromas in her right foot and has persistent pain.  She was concerned she has peripheral arterial disease although the wounds have healed well.  By exam today as well as ABIs appears to have good perfusion of the right lower extremity and she has healed the wounds without any issues.  I do not think she needs vascular surgery intervention.  She can see me on an as-needed basis.     Maeola HarmanBrandon Christopher Analyah Mcconnon MD [facility] [place]

## 2019-05-23 ENCOUNTER — Encounter: Payer: Self-pay | Admitting: Family Medicine

## 2019-07-26 LAB — HM DIABETES EYE EXAM

## 2019-09-13 ENCOUNTER — Other Ambulatory Visit: Payer: Self-pay

## 2019-09-13 ENCOUNTER — Other Ambulatory Visit: Payer: Self-pay | Admitting: Chiropractic Medicine

## 2019-09-13 ENCOUNTER — Ambulatory Visit
Admission: RE | Admit: 2019-09-13 | Discharge: 2019-09-13 | Disposition: A | Discharge status: Home / Self Care | Payer: Managed Care, Other (non HMO) | Source: Ambulatory Visit | Attending: Chiropractic Medicine | Admitting: Chiropractic Medicine

## 2019-10-01 ENCOUNTER — Encounter: Payer: Self-pay | Admitting: Family Medicine

## 2019-10-01 NOTE — Patient Instructions (Addendum)
HEALTH MAINTENANCE RECOMMENDATIONS:  It is recommended that you get at least 30 minutes of aerobic exercise at least 5 days/week (for weight loss, you may need as much as 60-90 minutes). This can be any activity that gets your heart rate up. This can be divided in 10-15 minute intervals if needed, but try and build up your endurance at least once a week.  Weight bearing exercise is also recommended twice weekly.  Eat a healthy diet with lots of vegetables, fruits and fiber.  "Colorful" foods have a lot of vitamins (ie green vegetables, tomatoes, red peppers, etc).  Limit sweet tea, regular sodas and alcoholic beverages, all of which has a lot of calories and sugar.  Up to 1 alcoholic drink daily may be beneficial for women (unless trying to lose weight, watch sugars).  Drink a lot of water.  Calcium recommendations are 1200-1500 mg daily (1500 mg for postmenopausal women or women without ovaries), and vitamin D 1000 IU daily.  This should be obtained from diet and/or supplements (vitamins), and calcium should not be taken all at once, but in divided doses.  Monthly self breast exams and yearly mammograms for women over the age of 58 is recommended.  Sunscreen of at least SPF 30 should be used on all sun-exposed parts of the skin when outside between the hours of 10 am and 4 pm (not just when at beach or pool, but even with exercise, golf, tennis, and yard work!)  Use a sunscreen that says "broad spectrum" so it covers both UVA and UVB rays, and make sure to reapply every 1-2 hours.  Remember to change the batteries in your smoke detectors when changing your clock times in the spring and fall. Carbon monoxide detectors are recommended for your home.  Use your seat belt every time you are in a car, and please drive safely and not be distracted with cell phones and texting while driving.  I recommend getting the new shingles vaccine (Shingrix). You will need to check with your insurance to see if it  is covered, and if covered, schedule a nurse visit at our office.  It is a series of two injections, spaced 2 months apart.   Consider seeing Dr. Margaretha Sheffield (sports medicine) for ongoing pain and swelling in your right elbow area (possible diagnoses include tennis elbow vs pitcher's elbow).  You can try wearing the tennis elbow strap/band when playing softball.    Pitcher's Elbow  Pitcher's elbow is a type of elbow injury that develops gradually over time (overuse injury). This condition is also called valgus extension overload syndrome (VEOS). This injury is common in athletes who make repeated overhead throwing motions, such as a baseball pitcher repeatedly throwing a ball. Throwing motions can:  Overstretch the strong band of tissue (ligament) on the inside of the elbow (ulnar collateral ligament, or UCL). UCL injuries can range from minor inflammation to a complete ligament tear.  Push the bones at the outside and back of the elbow together (compression). These forces can injure the ligament over time and create an abnormal extra bone in the elbow (osteophyte or bone spur). What are the causes? This condition may be caused by:  Continuous or repetitive overhead throwing, such as baseball pitching.  Improper throwing motion.  Tightness in the shoulder that puts added demand on the elbow. What increases the risk? This condition is more likely to develop in athletes who play sports that involve repetitive forceful straightening of the elbow, such as:  Baseball or  softball.  Tennis and other racquet sports.  Football.  Lacrosse.  Gymnastics.  Javelin. What are the signs or symptoms? Symptoms of this condition include:  Elbow pain.  Increased pain in your elbow as you straighten your arm forcefully, such as when throwing.  Swelling.  Limited range of motion or "locking" of the elbow.  Popping or tearing sensation in your elbow. How is this diagnosed? This condition is  diagnosed based on:  Your symptoms and medical history.  A physical exam. Your health care provider may: ? Check your elbow's strength, stability, and range of motion. ? Compare your injured elbow to your other elbow. ? Gently press your arm and elbow to find the source of pain.  Imaging tests, such as: ? X-rays or CT scans to check for stress fractures and bone spurs. ? Ultrasound or MRI to check for tears in the ligaments or tendons. How is this treated? Treatment for this condition may include:  Stopping activities that require overhead arm motions, then returning gradually to full activities.  Modifying your sports technique to decrease elbow strain. This may include wearing a brace.  Taking medicine to relieve pain.  Injecting medicines (corticosteroids) into your elbow to reduce swelling and pain.  Doing strength and range-of-motion exercises (physical therapy) as told by your health care provider.  Surgery. This may be needed if all other treatments do not work. It may involve: ? Repairing damaged ligaments. ? Removing abnormal bone growths. ? Removing pieces of bone or cartilage. After surgery, you will have to wear a brace for several weeks and eventually have physical therapy. Follow these instructions at home: If you have a brace:  Wear the brace as told by your health care provider. Remove it only as told by your health care provider.  Loosen the brace if your fingers tingle, become numb, or turn cold and blue.  Keep the brace clean.  If the brace is not waterproof: ? Do not let it get wet. ? Cover it with a watertight covering when you take a bath or a shower. Managing pain, stiffness, and swelling   If directed, put ice on the injured area. ? If you have a removable brace, remove it as told by your health care provider. ? Put ice in a plastic bag. ? Place a towel between your skin and the bag. ? Leave the ice on for 20 minutes, 2-3 times a day.  Move  your fingers often to avoid stiffness and to lessen swelling.  Raise (elevate) the injured area above the level of your heart while you are sitting or lying down. Activity  Rest your elbow and avoid activities that require overhead arm motions as told by your health care provider.  Return to your normal activities as told by your health care provider. Ask your health care provider what activities are safe for you.  Do exercises as told by your health care provider. General instructions  Do not use any products that contain nicotine or tobacco, such as cigarettes, e-cigarettes, and chewing tobacco. These can delay healing. If you need help quitting, ask your health care provider.  Take over-the-counter and prescription medicines only as told by your health care provider.  Keep all follow-up visits as told by your health care provider. This is important. How is this prevented?  Warm up and stretch before being active.  Cool down and stretch after being active.  Give your body time to rest between periods of activity.  Maintain physical fitness, including: ?  Strength. ? Flexibility.  Have your technique checked to make sure you use proper form.  Rest your elbow if you show signs of fatigue.  When you start any new athletic activity, increase your participation slowly.  Follow the rules for your sport on how often and how much you can throw in a game.  Avoid overhead throwing motions as told by your health care provider. Contact a health care provider if:  Your pain does not improve or it gets worse after 2-4 weeks of rest. Get help right away if:  Your pain is severe.  You cannot move your arm or elbow. These symptoms may represent a serious problem that is an emergency. Do not wait to see if the symptoms will go away. Get medical help right away. Call your local emergency services (911 in the [place] Do not drive yourself to the hospital. Summary  Pitcher's elbow is a  type of elbow injury that develops gradually over time.  Treatment depends on the severity of the injury.  Rest your elbow and avoid activities that require overhead arm motions as told by your health care provider.  If directed, put ice on the injured area. This information is not intended to replace advice given to you by your health care provider. Make sure you discuss any questions you have with your health care provider. Document Released: 12/05/2005 Document Revised: 03/28/2019 Document Reviewed: 05/31/2018 Elsevier Patient Education  [employer].   Tennis Elbow  Tennis elbow (lateral epicondylitis) is inflammation of tendons in your outer forearm, near your elbow. Tendons are tissues that connect muscle to bone. When you have tennis elbow, inflammation affects the tendons that you use to bend your wrist and move your hand up. Inflammation occurs in the lower part of the upper arm bone (humerus), where the tendons connect to the bone (lateral epicondyle). Tennis elbow often affects people who play tennis, but anyone may get the condition from repeatedly extending the wrist or turning the forearm. What are the causes? This condition is usually caused by repeatedly extending the wrist, turning the forearm, and using the hands. It can result from sports or work that requires repetitive forearm movements. In some cases, it may be caused by a sudden injury. What increases the risk? You are more likely to develop tennis elbow if you play tennis or another racket sport. You also have a higher risk if you frequently use your hands for work. Besides people who play tennis, others at greater risk include:  Musicians.  Carpenters, painters, and plumbers.  Cooks.  Cashiers.  People who work in [employer].  Architect workers.  Butchers.  People who use computers. What are the signs or symptoms? Symptoms of this condition include:  Pain and tenderness in the forearm and the  outer part of the elbow. Pain may be felt only when using the arm, or it may be there all the time.  A burning feeling that starts in the elbow and spreads down the forearm.  A weak grip in the hand. How is this diagnosed? This condition may be diagnosed based on:  Your symptoms and medical history.  A physical exam.  X-rays.  MRI. How is this treated? Resting and icing your arm is often the first treatment. Your health care provider may also recommend:  Medicines to reduce pain and inflammation. These may be in the form of a pill, topical gels, or shots of a steroid medicine (cortisone).  An elbow strap to reduce stress on the  area.  Physical therapy. This may include massage or exercises.  An elbow brace to restrict the movements that cause symptoms. If these treatments do not help relieve your symptoms, your health care provider may recommend surgery to remove damaged muscle and reattach healthy muscle to bone. Follow these instructions at home: Activity  Rest your elbow and wrist and avoid activities that cause symptoms, as told by your health care provider.  Do physical therapy exercises as instructed.  If you lift an object, lift it with your palm facing up. This reduces stress on your elbow. Lifestyle  If your tennis elbow is caused by sports, check your equipment and make sure that: ? You are using it correctly. ? It is the best fit for you.  If your tennis elbow is caused by work or computer use, take frequent breaks to stretch your arm. Talk with your manager about ways to manage your condition at work. If you have a brace:  Wear the brace or strap as told by your health care provider. Remove it only as told by your health care provider.  Loosen the brace if your fingers tingle, become numb, or turn cold and blue.  Keep the brace clean.  If the brace is not waterproof, ask if you may remove it for bathing. If you must keep the brace on while bathing: ? Do  not let it get wet. ? Cover it with a watertight covering when you take a bath or a shower. General instructions   If directed, put ice on the painful area: ? Put ice in a plastic bag. ? Place a towel between your skin and the bag. ? Leave the ice on for 20 minutes, 2-3 times a day.  Take over-the-counter and prescription medicines only as told by your health care provider.  Keep all follow-up visits as told by your health care provider. This is important. Contact a health care provider if:  You have pain that gets worse or does not get better with treatment.  You have numbness or weakness in your forearm, hand, or fingers. Summary  Tennis elbow (lateral epicondylitis) is inflammation of tendons in your outer forearm, near your elbow.  Common symptoms include pain and tenderness in your forearm and the outer part of your elbow.  This condition is usually caused by repeatedly extending your wrist, turning your forearm, and using your hands.  The first treatment is often resting and icing your arm to relieve symptoms. Further treatment may include taking medicine, getting physical therapy, wearing a brace or strap, or having surgery. This information is not intended to replace advice given to you by your health care provider. Make sure you discuss any questions you have with your health care provider. Document Released: 12/05/2005 Document Revised: 08/31/2018 Document Reviewed: 09/19/2017 Elsevier Patient Education  2020 [employer].
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
## 2019-10-02 ENCOUNTER — Encounter: Payer: Self-pay | Admitting: Family Medicine

## 2019-10-02 ENCOUNTER — Other Ambulatory Visit: Payer: Self-pay

## 2019-10-02 ENCOUNTER — Ambulatory Visit (INDEPENDENT_AMBULATORY_CARE_PROVIDER_SITE_OTHER): Payer: Managed Care, Other (non HMO) | Admitting: Family Medicine

## 2019-10-02 VITALS — BP 124/82 | HR 68 | Temp 98.7°F | Ht 64.25 in | Wt 149.0 lb

## 2019-10-02 DIAGNOSIS — Z0000 Encounter for general adult medical examination without abnormal findings: Secondary | ICD-10-CM | POA: Diagnosis not present

## 2019-10-02 DIAGNOSIS — E7800 Pure hypercholesterolemia, unspecified: Secondary | ICD-10-CM

## 2019-10-02 DIAGNOSIS — E119 Type 2 diabetes mellitus without complications: Secondary | ICD-10-CM

## 2019-10-02 DIAGNOSIS — Z23 Encounter for immunization: Secondary | ICD-10-CM | POA: Diagnosis not present

## 2019-10-02 DIAGNOSIS — M25521 Pain in right elbow: Secondary | ICD-10-CM

## 2019-10-15 ENCOUNTER — Other Ambulatory Visit: Payer: Self-pay

## 2019-10-15 ENCOUNTER — Telehealth: Payer: Self-pay | Admitting: Family Medicine

## 2019-10-15 ENCOUNTER — Encounter: Payer: Self-pay | Admitting: Family Medicine

## 2019-10-15 ENCOUNTER — Ambulatory Visit (INDEPENDENT_AMBULATORY_CARE_PROVIDER_SITE_OTHER): Payer: Managed Care, Other (non HMO) | Admitting: Family Medicine

## 2019-10-15 VITALS — BP 110/72 | HR 77 | Temp 98.6°F | Wt 151.2 lb

## 2019-10-15 DIAGNOSIS — L739 Follicular disorder, unspecified: Secondary | ICD-10-CM

## 2019-10-15 DIAGNOSIS — Z23 Encounter for immunization: Secondary | ICD-10-CM | POA: Diagnosis not present

## 2019-10-15 MED ORDER — SULFAMETHOXAZOLE-TRIMETHOPRIM 800-160 MG PO TABS: 1.0000 | ORAL_TABLET | Freq: Two times a day (BID) | ORAL | 0 refills | Status: DC

## 2019-10-15 NOTE — Telephone Encounter (Signed)
Requested records received from [facility] 

## 2019-10-15 NOTE — Progress Notes (Signed)
   Subjective:    Patient ID: Vanessa [name], female    DOB: August 04, 1966, 53 y.o.   MRN: [id number]  HPI She is here for evaluation of a rash on her left arm.  This has been bothering her for several days.  She has it nowhere else on her body.   Review of Systems     Objective:   Physical Exam Alert and in no distress.  Exam of the left arm does show multiple raised macular lesions several which appear to have had a papule on it.  Lesions were noted nowhere else.       Assessment & Plan:  Need for shingles vaccine - Plan: Varicella-zoster vaccine IM  Folliculitis - Plan: sulfamethoxazole-trimethoprim (BACTRIM DS) 800-160 MG tablet I explained that I thought this was more of a infectious process however if she does not respond to the Bactrim, I will refer to dermatology.  She has an allergy to doxycycline

## 2019-10-15 NOTE — Telephone Encounter (Signed)
Requested records received from [facility].  °

## 2019-10-16 ENCOUNTER — Encounter: Payer: Self-pay | Admitting: *Deleted

## 2019-10-17 ENCOUNTER — Other Ambulatory Visit: Payer: Self-pay | Admitting: *Deleted

## 2019-10-18 ENCOUNTER — Other Ambulatory Visit: Payer: Managed Care, Other (non HMO)

## 2019-10-22 LAB — HEMOGLOBIN A1C: Hemoglobin A1C: 6.6

## 2019-10-23 ENCOUNTER — Other Ambulatory Visit: Payer: Self-pay

## 2019-10-23 DIAGNOSIS — Z20822 Contact with and (suspected) exposure to covid-19: Secondary | ICD-10-CM

## 2019-10-24 ENCOUNTER — Encounter: Payer: Self-pay | Admitting: *Deleted

## 2019-10-24 LAB — NOVEL CORONAVIRUS, NAA: SARS-CoV-2, NAA: NOT DETECTED

## 2019-10-25 ENCOUNTER — Other Ambulatory Visit: Payer: Self-pay

## 2019-12-18 ENCOUNTER — Other Ambulatory Visit (INDEPENDENT_AMBULATORY_CARE_PROVIDER_SITE_OTHER): Payer: Managed Care, Other (non HMO)

## 2019-12-18 ENCOUNTER — Other Ambulatory Visit: Payer: Self-pay

## 2019-12-18 DIAGNOSIS — Z23 Encounter for immunization: Secondary | ICD-10-CM

## 2020-02-04 ENCOUNTER — Telehealth: Payer: Self-pay | Admitting: Family Medicine

## 2020-02-04 NOTE — Telephone Encounter (Signed)
Pt is scediled fpr CPE on 10/07/2020 and she wants to know if she can come in another day for get her labs done

## 2020-02-04 NOTE — Telephone Encounter (Signed)
Dr. Talmage Nap (her endocrinologist) does a lot of her labs, so I don't always do routine labs for her.  I wouldn't want to duplicate care.  If she wants, she can make sure that Dr. Talmage Nap sends [place] all bloodwork that may have been done between now and then, and check back with [place] the week before her physical to see if we even need to do anything (depends on what Dr. Talmage Nap does--last year she had those labs done in 03/2019, she did lipids, D, chem, etc).

## 2020-02-12 NOTE — Telephone Encounter (Signed)
Called and informed pt.  

## 2020-03-20 ENCOUNTER — Other Ambulatory Visit: Payer: Self-pay | Admitting: Family Medicine

## 2020-03-20 DIAGNOSIS — Z1231 Encounter for screening mammogram for malignant neoplasm of breast: Secondary | ICD-10-CM

## 2020-04-17 LAB — LIPID PANEL: LDL Cholesterol: 84

## 2020-04-17 LAB — HEMOGLOBIN A1C: Hemoglobin A1C: 6.3

## 2020-05-08 ENCOUNTER — Ambulatory Visit
Admission: RE | Admit: 2020-05-08 | Discharge: 2020-05-08 | Disposition: A | Discharge status: Home / Self Care | Payer: Managed Care, Other (non HMO) | Source: Ambulatory Visit | Attending: Family Medicine | Admitting: Family Medicine

## 2020-05-08 ENCOUNTER — Other Ambulatory Visit: Payer: Self-pay

## 2020-05-08 DIAGNOSIS — Z1231 Encounter for screening mammogram for malignant neoplasm of breast: Secondary | ICD-10-CM

## 2020-09-03 LAB — HM PAP SMEAR: HM Pap smear: NEGATIVE

## 2020-09-21 ENCOUNTER — Encounter: Payer: Self-pay | Admitting: Family Medicine

## 2020-09-21 ENCOUNTER — Ambulatory Visit (INDEPENDENT_AMBULATORY_CARE_PROVIDER_SITE_OTHER): Payer: BC Managed Care – PPO | Admitting: Family Medicine

## 2020-09-21 ENCOUNTER — Other Ambulatory Visit: Payer: Self-pay

## 2020-09-21 VITALS — BP 120/70 | HR 80 | Ht 64.25 in | Wt 152.0 lb

## 2020-09-21 DIAGNOSIS — M7918 Myalgia, other site: Secondary | ICD-10-CM | POA: Diagnosis not present

## 2020-09-21 DIAGNOSIS — M533 Sacrococcygeal disorders, not elsewhere classified: Secondary | ICD-10-CM | POA: Diagnosis not present

## 2020-09-21 NOTE — Patient Instructions (Addendum)
  See Tonye Becket as scheduled--I think chiropractic treatment and active release technique can help you.  Do the stretches as shown--Best done after heat. (use ice after exercise only) Touching toes with your feet/legs crossed (in both directions). Figure of 4 stretch when laying down (crossing your left heel to your right knee, and pulling reaching behind your right knee and pulling towards you. Crossing your knees and pulling your knees towards you (hands on ankles)  You can restart the meloxicam--be sure to take it with food.  Voltaren gel is a topical anti-inflammatory that you can consider using on your toe when no longer taking meloxicam   [facility] and/or the practice on [address] (Dr. Emily Filbert, Dr. Sharyn Lull)

## 2020-09-21 NOTE — Progress Notes (Signed)
Chief Complaint  Patient presents with  . Hip Pain    left hip pain. Larey Seat 2 years ago playing softball, went to PT. Better today but last week had a sharp pain going down her leg.     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  It lasted a couple of days.  Today it feels better.  No OTC meds.  She took hot baths with epsom salt. She took meloxicam x 2 months for her toe, stopped taking it about a week ago, when it ran out (didn't pick up refill, debating if she will continue, isn't sure if it is helping her foot; foot not worse or better).  Denies back pain, just at the L hip/SI region. She had some pian radiating down the front of the left leg, resolved Had some numbness, no weakness. Pain is worse with prolonged sitting/driving.  Denies any trauma, injury.  Playing softball per usual.   PMH, PSH, SH reviewed  Outpatient Encounter Medications as of 09/21/2020  Medication Sig Note  . cholecalciferol (VITAMIN D) 1000 UNITS tablet Take 1,000 Units by mouth daily. Reported on 02/24/2016   . Continuous Blood Gluc Sensor (FREESTYLE LIBRE 14 DAY SENSOR) MISC Apply topically every 14 (fourteen) days.   Marland Kitchen JANUMET XR 50-1000 MG TB24 Take 1 tablet by mouth 2 (two) times daily with a meal. 02/24/2016: Received from: External Pharmacy  . simvastatin (ZOCOR) 20 MG tablet Take 20 mg by mouth every evening.   . [DISCONTINUED] ONE TOUCH ULTRA TEST test strip  02/02/2015: Received from: External Pharmacy  . meloxicam (MOBIC) 15 MG tablet Take 15 mg by mouth daily. (Patient not taking: Reported on 09/21/2020)   . [DISCONTINUED] diphenhydrAMINE (BENADRYL) 25 MG tablet Take 25 mg by mouth every 6 (six) hours as needed.   . [DISCONTINUED] Probiotic Product (PROBIOTIC DAILY PO) Take 1 capsule by mouth daily.   . [DISCONTINUED] sulfamethoxazole-trimethoprim (BACTRIM DS) 800-160 MG tablet Take 1 tablet by mouth 2 (two) times daily.    No facility-administered encounter  medications on file as of 09/21/2020.   Allergies  Allergen Reactions  . Minocycline Rash    ROS: no fever, chills, headaches, dizziness, URI symptoms, chest pain.  +L hip/leg pain per HPI. No weakness. No numbness, tingling. No swelling. No rashes, bleeding, bruising   PHYSICAL EXAM:  BP 120/70   Pulse 80   Ht 5' 4.25" (1.632 m)   Wt 152 lb (68.9 kg)   BMI 25.89 kg/m   Pleasant, well-appearing female, in no distress HEENT: conjunctiva and sclera are clear, EOMI, wearing mask Neck: no lymphadenopathy or mass Heart: regular rate and rhythm Lungs: clear bilaterally Back: no spinal or CVA tenderness Tender at L SI joint and into the gluteal muscles on the left. Some discomfort with pyriformis stretches, though good ROM Some discomfort with gluteal stretches Some mild tenderness over the IT band and the left trochanteric bursa. Normal strength, sensation, DTR's. Normal gait Skin without rashes, bruising  ASSESSMENT/PLAN:  Pain in left buttock - muscular pain related to gluteus muscles and pyriformis. Heat, stretches, massage.  Sacroiliac pain - L SI joint.  heat, NSAID. f/u with chiro, as is already scheduled   See Damien as scheduled--I think chiropractic treatment and active release technique can help you.  Do the stretches as shown--Best done after heat. (use ice after exercise only) Touching toes with your feet/legs crossed (in both directions). Figure of 4 stretch when laying down (crossing your left  heel to your right knee, and pulling reaching behind your right knee and pulling towards you. Crossing your knees and pulling your knees towards you (hands on ankles)  You can restart the meloxicam--be sure to take it with food.

## 2020-10-06 NOTE — Patient Instructions (Signed)

## 2020-10-06 NOTE — Progress Notes (Signed)
Chief Complaint  Patient presents with  . Annual Exam    nonfasting annual exam. Went to OB/Gyn and had pap done, she had small hemorrhoid at the time and didn't have rectal exam, wants to know if you can do. Has a place where she had her C-section done that gets very sensitive.     Vanessa [name] is a 29 female who presents for a complete physical.   She has the following concerns:  Seen recently with left buttock pain, noted to have at L SI, and into pyriformis/gluteal muscles.  She was instructed on stretches, to restart mobic, and to f/u as planned with chiropractor Tonye Becket).  She did this, started feeling better, but after long ride from [facility] this weekend it started flaring again.  Having difficulty getting an appointment with chiro due to her job location/timing. Chiropractor also worked on her foot (pain in right foot since surgery, felt to be related to scar tissue).  She got prescription from Dr. Harriet Pho for PT.  It wasn't covered at [facility], hasn't yet made appt elsewhere.  Diabetes:  Under the care of Dr. Talmage Nap. She prescribes her Janumet XR and simvastatin.   Last note received from Dr. Talmage Nap was 10/2019--A1c was 6.6; 03/2019 LDL was 86, normal alb/Cr ratio.  No notes/labs available since then. Records were received at end of her visit today, from 04/2020.  Last A1c was 6.3. Urine microalbumin was not done at that visit. She checks her feet regularly, denies lesions. Some discomfort since her surgery, so checks it regularly.   Denies polydipsia, polyuria, hypoglycemia.  Last eye exam --07/26/2019 at [facility] in [place]. She has appt scheduled for 10/2020.  She is under the care of Dr. Henreitta Leber for GYN care, last seen 08/2020.  She previously saw Dr. Pennie Rushing.  She reports they did pap smear. She has fibroids, and is s/p ablation.  She denies any bleeding. She has some mild hot flashes, no night sweats.  Immunization History  Administered Date(s) Administered  .  Influenza Split 10/21/2009, 10/12/2011, 09/05/2012, 09/04/2014  . Influenza,inj,Quad PF,6+ Mos 08/27/2015, 08/31/2016, 09/06/2020  . Influenza-Unspecified 09/15/2019  . Moderna SARS-COVID-2 Vaccination 02/20/2020, 03/27/2020  . Pneumococcal Conjugate-13 10/02/2019  . Pneumococcal Polysaccharide-23 07/20/2007  . Td 06/30/2016  . Tdap 06/18/2006  . Zoster Recombinat (Shingrix) 10/15/2019, 12/18/2019   Last Pap smear: 03/2017 by GYN; reports she just had one with recent exam 08/2020 (will get results) Last mammogram: 04/2020 Last colonoscopy: 10/2017, normal, Dr. Loreta Ave. Repeat 10 years Last DEXA: never Dentist: twice yearly Ophtho: yearly  Exercise: 2 days/week playing softball. Not much else currently. No weight-bearing exercise currently. Occasional cheese, yogurt, milk.  She donated blood earlier this year through her church.   PMH, PSH, SH and FH were updated and reviewed.  Outpatient Encounter Medications as of 10/07/2020  Medication Sig Note  . cholecalciferol (VITAMIN D) 1000 UNITS tablet Take 1,000 Units by mouth daily. Reported on 02/24/2016   . Continuous Blood Gluc Sensor (FREESTYLE LIBRE 14 DAY SENSOR) MISC Apply topically every 14 (fourteen) days.   Marland Kitchen JANUMET XR 50-1000 MG TB24 Take 1 tablet by mouth 2 (two) times daily with a meal. 02/24/2016: Received from: External Pharmacy  . meloxicam (MOBIC) 15 MG tablet Take 15 mg by mouth daily.    . simvastatin (ZOCOR) 20 MG tablet Take 20 mg by mouth every evening.    No facility-administered encounter medications on file as of 10/07/2020.   Allergies  Allergen Reactions  . Minocycline Rash  ROS: The patient denies anorexia, fever, weight changes, headaches, vision changes, decreased hearing, ear pain, sore throat, breast concerns, chest pain, palpitations, dizziness, syncope, dyspnea on exertion, cough, swelling, nausea, vomiting, diarrhea, constipation, abdominal pain, melena, hematochezia, indigestion/heartburn, hematuria,  incontinence, dysuria, vaginal bleeding, discharge, odor or itch, genital lesions, weakness, tremor, suspicious skin lesions, depression, anxiety, abnormal bleeding/bruising, or enlarged lymph nodes.  Rare hot flashes. R foot pain since surgery; still has tingling in 3rd and 4th toes since neuroma surgery. L hip/buttock pain per HPI   PHYSICAL EXAM:  BP 128/78   Pulse 80   Ht 5\' 4"  (1.626 m)   Wt 154 lb (69.9 kg)   BMI 26.43 kg/m   Wt Readings from Last 3 Encounters:  10/07/20 154 lb (69.9 kg)  09/21/20 152 lb (68.9 kg)  10/15/19 151 lb 3.2 oz (68.6 kg)    General Appearance:   Alert, cooperative, no distress, appears stated age  Head:   Normocephalic, without obvious abnormality, atraumatic  Eyes:   PERRL, conjunctiva/corneas clear, EOM's intact, fundi benign  Ears:   Normal TM's and external ear canals  Nose:  Not examined, wearing mask due to COVID-19 pandemic  Throat:  Not examined, wearing mask due to COVID-19 pandemic  Neck:  Supple, no lymphadenopathy; thyroid: noenlargement/ tenderness/nodules; no carotid bruit or JVD  Back:  Spine nontender, no curvature, ROM normal, no CVA tenderness. Slightly tender at L SI joint and into the gluteal muscles on the left. Normal ROM, and less discomfort with pyriformis/gluteal stretches compared to last visit.  Lungs:   Clear to auscultation bilaterally without wheezes, rales or ronchi; respirations unlabored  Chest Wall:   No tenderness or deformity  Heart:   Regular rate and rhythm, S1 and S2 normal, no murmur, rub or gallop  Breast Exam:  Deferred to GYN  Abdomen:   Soft, non-tender, nondistended, normoactive bowel sounds, no masses, no hepatosplenomegaly. Low transverse well healed incision.There are a few dark spots noted within this skin fold, two of which are more focal and slightly raised, more consistent with SK's. No erythema, inflammation, rash.  Genitalia:   Deferred to GYN  Rectal:  not performed (discussed--no symptoms, not needed today)  Extremities:  No clubbing, cyanosis or edema. WHSS between 2nd and 3rd and 3rd and 4th toes on R, nontender, no swelling or erythema. Normal monofilament sensation  Pulses:  2+ and symmetric all extremities  Skin:  Skin color, texture, turgor normal, no rashes or lesions  Lymph nodes:  Cervical, supraclavicular, and axillary nodes normal  Neurologic:  CNII-XII intact, normal strength, sensation and gait; reflexes 2+ and symmetric throughout    Psych: Normal mood, affect, hygiene and grooming  Normal diabetic foot exam   ASSESSMENT/PLAN:  Annual physical exam - Plan: POCT Urinalysis DIP (Proadvantage Device)  Type 2 diabetes mellitus without complication, without long-term current use of insulin (HCC) - well controlled per recent labs. Encouraged her to resume more regular exercise. - Plan: Microalbumin / creatinine urine ratio  Pure hypercholesterolemia - lipids at goal on statin per Dr. 10/17/19 labs; cont simvastatin  Sacroiliac pain - had improved, flared again. Cont stretches, f/u chiro   She had GYN visit 08/2020 Dr. 09/2020 [name]--get results  Discussed monthly self breast exams and yearly mammograms; at least 30 minutes of aerobic activity at least 5 days/week, weight-bearing exercise at least 2x/week; proper sunscreen use reviewed; healthy diet, including goals of calcium and vitamin D intake and alcohol recommendations (less than or equal to 1 drink/day) reviewed; regular  seatbelt use; changing batteries in smoke detectors. Immunization recommendations discussed--UTD.  Colonoscopy recommendations reviewed--UTD. Repeat in 10/2027  F/u 1 year, sooner prn. Continue regular f/u with Dr. Talmage Nap.

## 2020-10-07 ENCOUNTER — Other Ambulatory Visit: Payer: Self-pay

## 2020-10-07 ENCOUNTER — Encounter: Payer: Self-pay | Admitting: Family Medicine

## 2020-10-07 ENCOUNTER — Ambulatory Visit: Payer: BC Managed Care – PPO | Admitting: Family Medicine

## 2020-10-07 VITALS — BP 128/78 | HR 80 | Ht 64.0 in | Wt 154.0 lb

## 2020-10-07 DIAGNOSIS — M533 Sacrococcygeal disorders, not elsewhere classified: Secondary | ICD-10-CM | POA: Diagnosis not present

## 2020-10-07 DIAGNOSIS — E7800 Pure hypercholesterolemia, unspecified: Secondary | ICD-10-CM

## 2020-10-07 DIAGNOSIS — E119 Type 2 diabetes mellitus without complications: Secondary | ICD-10-CM

## 2020-10-07 DIAGNOSIS — Z0000 Encounter for general adult medical examination without abnormal findings: Secondary | ICD-10-CM

## 2020-10-07 LAB — POCT URINALYSIS DIP (PROADVANTAGE DEVICE)
Bilirubin, UA: NEGATIVE
Blood, UA: NEGATIVE
Glucose, UA: 500 mg/dL — AB
Leukocytes, UA: NEGATIVE
Nitrite, UA: NEGATIVE
Specific Gravity, Urine: 1.03
Urobilinogen, Ur: NEGATIVE
pH, UA: 5.5 (ref 5.0–8.0)

## 2020-10-08 LAB — MICROALBUMIN / CREATININE URINE RATIO
Creatinine, Urine: 127.4 mg/dL
Microalb/Creat Ratio: 4 mg/g creat (ref 0–29)
Microalbumin, Urine: 5.3 ug/mL

## 2020-10-19 ENCOUNTER — Encounter: Payer: Self-pay | Admitting: Family Medicine

## 2020-10-21 ENCOUNTER — Telehealth: Payer: Self-pay | Admitting: Family Medicine

## 2020-10-21 NOTE — Telephone Encounter (Signed)
Requested records received from [facility] 

## 2020-10-29 ENCOUNTER — Encounter: Payer: Self-pay | Admitting: Family Medicine

## 2020-11-04 LAB — HEMOGLOBIN A1C: Hemoglobin A1C: 6.9

## 2020-11-11 LAB — HM DIABETES EYE EXAM

## 2020-11-19 ENCOUNTER — Encounter: Payer: Self-pay | Admitting: Family Medicine

## 2020-11-24 ENCOUNTER — Telehealth: Payer: Self-pay | Admitting: Family Medicine

## 2020-11-24 NOTE — Telephone Encounter (Signed)
Requested records received from [facility]

## 2020-11-26 ENCOUNTER — Encounter: Payer: Self-pay | Admitting: Family Medicine

## 2021-01-20 ENCOUNTER — Other Ambulatory Visit (INDEPENDENT_AMBULATORY_CARE_PROVIDER_SITE_OTHER): Payer: BC Managed Care – PPO

## 2021-01-20 ENCOUNTER — Telehealth (INDEPENDENT_AMBULATORY_CARE_PROVIDER_SITE_OTHER): Payer: BC Managed Care – PPO | Admitting: Family Medicine

## 2021-01-20 ENCOUNTER — Other Ambulatory Visit: Payer: Self-pay

## 2021-01-20 ENCOUNTER — Encounter: Payer: Self-pay | Admitting: Family Medicine

## 2021-01-20 VITALS — Temp 98.1°F | Wt 154.0 lb

## 2021-01-20 DIAGNOSIS — R0981 Nasal congestion: Secondary | ICD-10-CM

## 2021-01-20 DIAGNOSIS — J069 Acute upper respiratory infection, unspecified: Secondary | ICD-10-CM | POA: Diagnosis not present

## 2021-01-20 DIAGNOSIS — J029 Acute pharyngitis, unspecified: Secondary | ICD-10-CM

## 2021-01-20 DIAGNOSIS — R059 Cough, unspecified: Secondary | ICD-10-CM

## 2021-01-20 LAB — POC COVID19 BINAXNOW: SARS Coronavirus 2 Ag: NEGATIVE

## 2021-01-20 MED ORDER — BENZONATATE 200 MG PO CAPS: 200.0000 mg | ORAL_CAPSULE | Freq: Three times a day (TID) | ORAL | 0 refills | Status: DC | PRN

## 2021-01-20 NOTE — Progress Notes (Signed)
Start time: 11:17 End time: 11:33  Virtual Visit via Video Note  I connected with Vanessa [name] on 01/20/21 by a video enabled telemedicine application and verified that I am speaking with the correct person using two identifiers.  Location: Patient: work Provider: office   I discussed the limitations of evaluation and management by telemedicine and the availability of in person appointments. The patient expressed understanding and agreed to proceed.  History of Present Illness:  Chief Complaint  Patient presents with  . Cough  . Nasal Congestion  . Sore Throat    Started  a week and half ago mostly at night    Home COVID test was negative on 1/26, on the third day of symptoms. She started with a bad sore throat, headache.  She is having a cough when laying down at night, slight headache (above eyes/eyebrows).  Denies runny nose, mucus she blows from the nose is clear.  She will cough up some yellow phlegm (just at night and first thing in the morning, not as much during the day). Denies any chest pain or shortness of breath.  She had some diarrhea over the past couple of days. No nausea or vomiting.  Drinking hot tea. Taking Nyquil at night.  Tylenol prn for pain.  Her daughter had COVID 1/12.   Patient had exposure prior to her daughter's illness, had tested negative. (tested once before daughter's illness, and then on 3rd day of symptoms).   PMH, PSH, SH reviewed  Outpatient Encounter Medications as of 01/20/2021  Medication Sig Note  . cholecalciferol (VITAMIN D) 1000 UNITS tablet Take 1,000 Units by mouth daily. Reported on 02/24/2016   . Continuous Blood Gluc Sensor (FREESTYLE LIBRE 14 DAY SENSOR) MISC Apply topically every 14 (fourteen) days.   Marland Kitchen JANUMET XR 50-1000 MG TB24 Take 1 tablet by mouth 2 (two) times daily with a meal. 02/24/2016: Received from: External Pharmacy  . Probiotic Product (CULTURELLE PROBIOTICS PO)    . simvastatin (ZOCOR) 20 MG tablet  Take 20 mg by mouth every evening.   . meloxicam (MOBIC) 15 MG tablet Take 15 mg by mouth daily.  (Patient not taking: Reported on 01/20/2021)    No facility-administered encounter medications on file as of 01/20/2021.   Allergies  Allergen Reactions  . Minocycline Rash    ROS: Denies fevers (one night slightly feverish), no chills. No current headaches, dizziness, chest pain.  URI symptoms per HPI. Diarrhea per HPI, no nausea, vomiting, rashes, urinary complaints or other concerns.   See HPI   Observations/Objective:  Temp 98.1 F (36.7 C)   Wt 154 lb (69.9 kg)   BMI 26.43 kg/m   Well-appearing, pleasant female, in good spirits. She is speaking easily, in no distress. No coughing during the visit. Exam is limited due to the virtual nature of the visit  Assessment and Plan:  Viral upper respiratory tract infection - supportive measures reviewed.  contact us or f/u if sx persist/worsen/change  Cough - Plan: POC COVID-19 BinaxNow, Novel Coronavirus, NAA ([facility]), benzonatate (TESSALON) 200 MG capsule  Sore throat - Plan: POC COVID-19 BinaxNow, Novel Coronavirus, NAA ([facility])  Nasal congestion - Plan: POC COVID-19 BinaxNow, Novel Coronavirus, NAA ([facility]) ' Mucinex, dextromethorphan, Sudafed prn Cont tylenol   Patient called (6:45pm) with COVID test results (rapid), since they weren't entered in the system for her to view through MyChart. She added that she had tried Delsym syrup, didn't help, and she didn't sleep well due to cough.  We discussed  tessalon and cough syrup (Tussionex, since hycodan isn't available at [facility]). She preferred Tessalon.  Sent to pharmacy. She will contact us if needing stronger cough medications.    Follow Up Instructions:    I discussed the assessment and treatment plan with the patient. The patient was provided an opportunity to ask questions and all were answered. The patient agreed with the plan and demonstrated an understanding of  the instructions.   The patient was advised to call back or seek an in-person evaluation if the symptoms worsen or if the condition fails to improve as anticipated.  I spent 23 minutes dedicated to the care of this patient, including pre-visit review of records, face to face time, post-visit ordering of testing and documentation.    Lavonda Jumbo, MD

## 2021-01-20 NOTE — Patient Instructions (Addendum)
Stay well hydrated. For symptom relief use these medications-- mucinex (guaifenesin is expectorant, to loosen up thick mucus), dextromethorphan (either as Delsym syrup, or in combination with mucinex, Mucinex-DM--don't use both forms together). Sudafed as needed for sinus pain/congestion (phenylephrine or pseudoephedrine). Continue tylenol as needed for any pain or fever. We sent in a prescription of tessalon (benzonatate), which is a cough medication that you can use up to three times daily if needed for cough. If that doesn't help, contact us and we can send in the prescription for Tussionex (the 12 hour cough syrup with hydrocodone that we spoke of on the phone).  Contact us if symptoms persist or worsen.  We will be in touch with your COVID PCR results.

## 2021-01-21 LAB — SARS-COV-2, NAA 2 DAY TAT

## 2021-01-21 LAB — NOVEL CORONAVIRUS, NAA: SARS-CoV-2, NAA: NOT DETECTED

## 2021-03-01 ENCOUNTER — Other Ambulatory Visit: Payer: Self-pay | Admitting: Family Medicine

## 2021-03-01 DIAGNOSIS — Z1231 Encounter for screening mammogram for malignant neoplasm of breast: Secondary | ICD-10-CM

## 2021-03-19 LAB — HM DIABETES FOOT EXAM: HM Diabetic Foot Exam: NORMAL

## 2021-05-04 ENCOUNTER — Other Ambulatory Visit: Payer: Self-pay

## 2021-05-04 ENCOUNTER — Ambulatory Visit
Admission: RE | Admit: 2021-05-04 | Discharge: 2021-05-04 | Disposition: A | Discharge status: Home / Self Care | Payer: BC Managed Care – PPO | Source: Ambulatory Visit | Attending: Family Medicine | Admitting: Family Medicine

## 2021-05-04 DIAGNOSIS — Z1231 Encounter for screening mammogram for malignant neoplasm of breast: Secondary | ICD-10-CM

## 2021-05-12 ENCOUNTER — Telehealth: Payer: BC Managed Care – PPO | Admitting: Family Medicine

## 2021-05-12 ENCOUNTER — Other Ambulatory Visit: Payer: Self-pay

## 2021-05-12 ENCOUNTER — Telehealth: Payer: Self-pay | Admitting: Family Medicine

## 2021-05-12 ENCOUNTER — Other Ambulatory Visit (INDEPENDENT_AMBULATORY_CARE_PROVIDER_SITE_OTHER): Payer: BC Managed Care – PPO

## 2021-05-12 ENCOUNTER — Encounter: Payer: Self-pay | Admitting: Family Medicine

## 2021-05-12 VITALS — Temp 97.1°F | Ht 64.0 in | Wt 154.0 lb

## 2021-05-12 DIAGNOSIS — R0981 Nasal congestion: Secondary | ICD-10-CM | POA: Diagnosis not present

## 2021-05-12 DIAGNOSIS — R059 Cough, unspecified: Secondary | ICD-10-CM | POA: Diagnosis not present

## 2021-05-12 DIAGNOSIS — J069 Acute upper respiratory infection, unspecified: Secondary | ICD-10-CM

## 2021-05-12 DIAGNOSIS — R058 Other specified cough: Secondary | ICD-10-CM | POA: Diagnosis not present

## 2021-05-12 LAB — POC COVID19 BINAXNOW: SARS Coronavirus 2 Ag: NEGATIVE

## 2021-05-12 MED ORDER — AZITHROMYCIN 250 MG PO TABS: ORAL_TABLET | ORAL | 0 refills | Status: DC

## 2021-05-12 MED ORDER — BENZONATATE 200 MG PO CAPS: 200.0000 mg | ORAL_CAPSULE | Freq: Three times a day (TID) | ORAL | 0 refills | Status: DC | PRN

## 2021-05-12 NOTE — Telephone Encounter (Signed)
I did explain to her some of the reasons (to protect ourselves and to protect our other patients, who would not come to our visit if they would be around sick patients).  I also said that sometimes a visit starts out as virtual, but then we may have patient come in if we realize that we need to do an in-person exam (ie for abdominal pain)--obviously no my preferred way to see patients, since we don't block off two separate times for them.  She asked if there would be 2 charges, and I said not if it was on the same day.  She had upper respiratory symptoms and hadn't done a COVID test (she told V she did on day 3, she told me she last did a COVID test a month or two ago, not recently), so we did virtual visit, and also prescribed an antibiotic.  I do not think the care would be any different had she been seen in person.    I'm sure you will do a great job explaining to her why we have our policies in effect, and how we take good care of our patients!

## 2021-05-12 NOTE — Telephone Encounter (Signed)
Received an email from [place] with [facility] Patient Relations asking that I give Vanessa [name] a call and discuss our policy as to why patients can and cannot be seen in the office.  I called pt, reached voice mail, lmtrc.  [phone]

## 2021-05-12 NOTE — Progress Notes (Signed)
Start time:10:09 End time: 10:31  Virtual Visit via Video Note  I connected with Vanessa [name] on 05/12/21 by a video enabled telemedicine application and verified that I am speaking with the correct person using two identifiers.  Location: Patient: home Provider: office   I discussed the limitations of evaluation and management by telemedicine and the availability of in person appointments. The patient expressed understanding and agreed to proceed.  History of Present Illness:  Chief Complaint  Patient presents with  . Cough    VIRTUAL cough and sinus congestion x maybe 10 days. No fever, chills or body aches. Did a home covid test on day #3 and it was negative. Mucus was yellow.   She woke up with a sore throat about 10 days ago. She started taking sinus tablets and mucinex, then developed a bad cough.  Cough is worse at night.  5/19 the drainage choked her and she vomited up a lot of thick mucus (and food).  Mucus was thick, yellow.  She is still coughing. She took all the sinus meds she had (10 tablets), and when she ran out, switched to [place] she has at home.  Tessalon helps with her cough, only has 3-4 left. Has taken it twice a day (didn't bring it work).  She has been taking Mucinex DM regularly.   Mucus had cleared up a little, was lighter.  She thought she was getting better, until last night, when the cough kept her up, and noted it to be yellow this morning.  Hasn't been isolating, was at a softball party earlier this week. Has been working. No known sick contacts. She has NOT taken a COVID test with this illness, reports she took one last month.   PMH, PSH, SH reviewed Outpatient Encounter Medications as of 05/12/2021  Medication Sig Note  . benzonatate (TESSALON) 200 MG capsule Take 1 capsule (200 mg total) by mouth 3 (three) times daily as needed for cough.   . cholecalciferol (VITAMIN D) 1000 UNITS tablet Take 1,000 Units by mouth daily. Reported on  02/24/2016   . Continuous Blood Gluc Sensor (FREESTYLE LIBRE 14 DAY SENSOR) MISC Apply topically every 14 (fourteen) days.   Marland Kitchen dextromethorphan-guaiFENesin (MUCINEX DM) 30-600 MG 12hr tablet Take 1 tablet by mouth 2 (two) times daily.   Marland Kitchen JANUMET XR 50-1000 MG TB24 Take 1 tablet by mouth 2 (two) times daily with a meal. 02/24/2016: Received from: External Pharmacy  . Probiotic Product (CULTURELLE PROBIOTICS PO)    . simvastatin (ZOCOR) 20 MG tablet Take 20 mg by mouth every evening.   . [DISCONTINUED] meloxicam (MOBIC) 15 MG tablet Take 15 mg by mouth daily.  (Patient not taking: Reported on 01/20/2021)    No facility-administered encounter medications on file as of 05/12/2021.   Allergies  Allergen Reactions  . Minocycline Rash   ROS: No fever or chills. No headache (just early on, head hurt from coughing a lot) or sinus pain. Denies any shortness of breath or chest pain (just with coughing, from coughing). No nausea, vomiting (post-tussive on 5/16), no diarrhea. No bleeding, bruising or rashes. No sneezing or itchy/watery eyes.    Observations/Objective:  Temp (!) 97.1 F (36.2 C) (Temporal)   Ht 5\' 4"  (1.626 m)   Wt 154 lb (69.9 kg)   BMI 26.43 kg/m   Well-appearing female, with occasional dry-sounding cough during visit. She is alert, oriented, cranial nerves grossly intact. She is speaking easily, in no distress Exam is limited due to virtual  nature of the visit.   Assessment and Plan:  Upper respiratory tract infection, unspecified type - Rec COVID test--if was COVID, is possible rapid test neg now, will also check PCR  Cough - Plan: benzonatate (TESSALON) 200 MG capsule  Cough productive of purulent sputum - sx x 10d, fluctuating course, now worse, purulent. Treat with z-pak.  Pt diabetic. - Plan: azithromycin (ZITHROMAX) 250 MG tablet   Rapid COVID test, PCR if negative Tessalon refill z-pak  Follow Up Instructions:    I discussed the assessment and treatment plan  with the patient. The patient was provided an opportunity to ask questions and all were answered. The patient agreed with the plan and demonstrated an understanding of the instructions.   The patient was advised to call back or seek an in-person evaluation if the symptoms worsen or if the condition fails to improve as anticipated.  I spent 23 minutes dedicated to the care of this patient, including pre-visit review of records, face to face time, post-visit ordering of testing and documentation.    Lavonda Jumbo, MD

## 2021-05-13 LAB — NOVEL CORONAVIRUS, NAA: SARS-CoV-2, NAA: NOT DETECTED

## 2021-05-13 LAB — SARS-COV-2, NAA 2 DAY TAT

## 2021-05-14 ENCOUNTER — Ambulatory Visit: Payer: Self-pay

## 2021-05-19 ENCOUNTER — Other Ambulatory Visit: Payer: Self-pay

## 2021-05-19 ENCOUNTER — Ambulatory Visit
Admission: RE | Admit: 2021-05-19 | Discharge: 2021-05-19 | Disposition: A | Discharge status: Home / Self Care | Payer: BC Managed Care – PPO | Source: Ambulatory Visit | Attending: Family Medicine | Admitting: Family Medicine

## 2021-06-16 ENCOUNTER — Telehealth: Payer: Self-pay | Admitting: *Deleted

## 2021-06-16 NOTE — Telephone Encounter (Signed)
Not taking new Medicare. If they don't have medicare, I'll see them.  Need records sent

## 2021-06-16 NOTE — Telephone Encounter (Signed)
Patient called asking for 2 new patients to be seen if you are able, said she just loves you so much! First one is her sister in law, Leonette Most' wife-[employer]. She is seeing someone in [place] but is jealous of the care that [place] and Leonette Most get and when they talk about you she wants to be part of it. The second person is their "adoptive mother" of 20+ years. They call her mom, her name is Vanessa [name] 06/10/50-her PCP retired and she is in desperate need of a PCP and they would not trust anyone else with their "mother".

## 2021-06-16 NOTE — Telephone Encounter (Signed)
Patient advised.

## 2021-07-14 ENCOUNTER — Other Ambulatory Visit: Payer: Self-pay

## 2021-07-14 ENCOUNTER — Ambulatory Visit: Payer: BC Managed Care – PPO | Admitting: Family Medicine

## 2021-07-14 ENCOUNTER — Encounter: Payer: Self-pay | Admitting: Family Medicine

## 2021-07-14 VITALS — BP 128/78 | HR 88 | Ht 64.0 in | Wt 155.8 lb

## 2021-07-14 DIAGNOSIS — F40243 Fear of flying: Secondary | ICD-10-CM

## 2021-07-14 DIAGNOSIS — N898 Other specified noninflammatory disorders of vagina: Secondary | ICD-10-CM | POA: Diagnosis not present

## 2021-07-14 DIAGNOSIS — Z1152 Encounter for screening for COVID-19: Secondary | ICD-10-CM

## 2021-07-14 LAB — POCT WET PREP (WET MOUNT)
Clue Cells Wet Prep Whiff POC: NEGATIVE
Trichomonas Wet Prep HPF POC: ABSENT

## 2021-07-14 MED ORDER — ALPRAZOLAM 0.25 MG PO TABS: 0.2500 mg | ORAL_TABLET | Freq: Three times a day (TID) | ORAL | 0 refills | Status: DC | PRN

## 2021-07-14 NOTE — Progress Notes (Signed)
Chief Complaint  Patient presents with   Vaginal Discharge    Has been having some discharge and itching, took some acidophilus and is some better. Also is leaving for vacation on Saturday and last time she flew she has some anxiey, would like an rx for this. And not sure if she needs covid test-if so, can she do here today?    Last week she started with vaginal discharge and itching. Monday she started acidophilus and symptoms are improving. She feels much better today.  Denies pelvic pain or urinary symptoms. No abnormal bleeding.  Leaves Saturday for [place]. Last time she flew she was very nervous. Asking for medication to help with her nerves, for the flight. She is also asking for COVID PCR. She has had no exposures, and denies symptoms.  Asking for husband to be tested as well--he will not be able to get here today  PMH, PSH, SH reviewed  Outpatient Encounter Medications as of 07/14/2021  Medication Sig Note   ALPRAZolam (XANAX) 0.25 MG tablet Take 1-2 tablets (0.25-0.5 mg total) by mouth 3 (three) times daily as needed for anxiety.    cholecalciferol (VITAMIN D) 1000 UNITS tablet Take 1,000 Units by mouth daily. Reported on 02/24/2016    Continuous Blood Gluc Sensor (FREESTYLE LIBRE 14 DAY SENSOR) MISC Apply topically every 14 (fourteen) days.    JANUMET XR 50-1000 MG TB24 Take 1 tablet by mouth 2 (two) times daily with a meal. 02/24/2016: Received from: External Pharmacy   Lactobacillus (ACIDOPHILUS/BIFIDUS PO) Take 1 tablet by mouth daily.    Probiotic Product (CULTURELLE PROBIOTICS PO)     simvastatin (ZOCOR) 20 MG tablet Take 20 mg by mouth every evening.    [DISCONTINUED] azithromycin (ZITHROMAX) 250 MG tablet Take 2 tablets by mouth on first day, then 1 tablet by mouth on days 2 through 5    [DISCONTINUED] benzonatate (TESSALON) 200 MG capsule Take 1 capsule (200 mg total) by mouth 3 (three) times daily as needed for cough.    [DISCONTINUED] dextromethorphan-guaiFENesin (MUCINEX  DM) 30-600 MG 12hr tablet Take 1 tablet by mouth 2 (two) times daily.    No facility-administered encounter medications on file as of 07/14/2021.   Allergies  Allergen Reactions   Minocycline Rash    ROS: Denies fever, chills, URI symptoms, headaches, dizziness, shortness of breath, chest pain.  Denies nausea, vomiting, bowel changes, urinary complaints, bleeding, bruising, rash. Vaginal discharge and itching per HPI. Anxiety related to flying. See HPI   PHYSICAL EXAM:  BP 128/78   Pulse 88   Ht 5\' 4"  (1.626 m)   Wt 155 lb 12.8 oz (70.7 kg)   BMI 26.74 kg/m   Well-appearing, pleasant female, in no distress HEENT: conjunctiva and sclera are clear, EOMI, wearing mask Abdomen: soft, nontender External genitalia: normal, no lesions, no rash or erythema BUS/vaginal normal. Thin white discharge, small amount, present in vault. Normal wet prep, no hyphae on KOH Neuro: alert and oriented, normal strength, gait   ASSESSMENT/PLAN:  Vaginal discharge - no yeast, BV or other infection seen. Sx resolved with acidophilus - Plan: POCT Wet Prep ([place])  Anxiety with flying - reviewed risks/SE of xanax, how/when to use, not to mix with alcohol - Plan: ALPRAZolam (XANAX) 0.25 MG tablet, Novel Coronavirus, NAA ([facility])  Encounter for screening for COVID-19 - needed prior to flying to , per pt - Plan: Novel Coronavirus, NAA ([facility])  Discussed causes for vaginal itching other than yeast--moisture from a normal discharge can be a factor.  Encouraged to keep the area dry (changing pantiliner, powder).  She will double check COVID recommendations--may not have time (cannot guarantee) results prior to 7am flight on Saturday if husband comes tomorrow.  May need to do rapid test, if it is required.   I spent 32 minutes dedicated to the care of this patient, including pre-visit review of records, face to face time, post-visit ordering of testing and documentation.

## 2021-07-15 LAB — NOVEL CORONAVIRUS, NAA: SARS-CoV-2, NAA: NOT DETECTED

## 2021-07-15 LAB — SARS-COV-2, NAA 2 DAY TAT

## 2021-08-02 ENCOUNTER — Telehealth: Payer: Self-pay

## 2021-08-02 NOTE — Telephone Encounter (Signed)
Pt called & states she woke up and right knee & leg giving her a fit & it's swollen.  She was asking about the urgent care ortho & I gave her [facility], said she didn't have to have a referral & she was going to call them to see about getting in today.  She doesn't know what is wrong, twisted it, injured it or blood clot or what.  So she asked that I send you a message

## 2021-08-02 NOTE — Telephone Encounter (Signed)
Noted  

## 2021-09-09 ENCOUNTER — Encounter: Payer: Self-pay | Admitting: Family Medicine

## 2021-09-09 ENCOUNTER — Other Ambulatory Visit: Payer: Self-pay

## 2021-09-09 ENCOUNTER — Ambulatory Visit: Payer: BC Managed Care – PPO | Admitting: Family Medicine

## 2021-09-09 VITALS — BP 112/70 | HR 72 | Ht 64.0 in | Wt 158.8 lb

## 2021-09-09 DIAGNOSIS — N898 Other specified noninflammatory disorders of vagina: Secondary | ICD-10-CM

## 2021-09-09 DIAGNOSIS — Z23 Encounter for immunization: Secondary | ICD-10-CM | POA: Diagnosis not present

## 2021-09-09 DIAGNOSIS — E1165 Type 2 diabetes mellitus with hyperglycemia: Secondary | ICD-10-CM

## 2021-09-09 DIAGNOSIS — R109 Unspecified abdominal pain: Secondary | ICD-10-CM

## 2021-09-09 DIAGNOSIS — E119 Type 2 diabetes mellitus without complications: Secondary | ICD-10-CM

## 2021-09-09 DIAGNOSIS — B3731 Acute candidiasis of vulva and vagina: Secondary | ICD-10-CM

## 2021-09-09 DIAGNOSIS — B373 Candidiasis of vulva and vagina: Secondary | ICD-10-CM | POA: Diagnosis not present

## 2021-09-09 LAB — POCT WET PREP (WET MOUNT)
Clue Cells Wet Prep Whiff POC: NEGATIVE
Trichomonas Wet Prep HPF POC: ABSENT

## 2021-09-09 LAB — POCT URINALYSIS DIP (PROADVANTAGE DEVICE)
Blood, UA: NEGATIVE
Glucose, UA: 500 mg/dL — AB
Leukocytes, UA: NEGATIVE
Nitrite, UA: NEGATIVE
Protein Ur, POC: NEGATIVE mg/dL
Specific Gravity, Urine: 1.03
Urobilinogen, Ur: NEGATIVE
pH, UA: 6 (ref 5.0–8.0)

## 2021-09-09 LAB — GLUCOSE, POCT (MANUAL RESULT ENTRY): POC Glucose: 316 mg/dl — AB (ref 70–99)

## 2021-09-09 MED ORDER — FLUCONAZOLE 150 MG PO TABS: 150.0000 mg | ORAL_TABLET | Freq: Once | ORAL | 0 refills | Status: AC

## 2021-09-09 NOTE — Patient Instructions (Addendum)
Take a diflucan tablet to treat the yeast infection. Don't take the simvastatin for the next 3 days. If your yeast infection is completely resolved, you can take the second tablet next week (a week later), and again hold the simvastatin for 3 days.  Your sugar was very high today.  Please keep good track of your sugars, checking post-prandial sugars more frequently.  Follow up as planned with Dr. Talmage Nap.  High sugars increase the risk for yeast infections.  Your urine was very concentrated.  PLEASE work on drinking more water, at least 6-8 glasses daily.

## 2021-09-09 NOTE — Progress Notes (Signed)
Chief Complaint  Patient presents with   Abdominal Pain    Right sided lower abdominal pain that started last week, comes and goes. Came for visit in July for vaginal discharge, same type of discharge and slightly itchy. Feels like the pain is worse when the discharge is heavier.   She has had vaginal discharge off/on since July.  She has slight itching--doesn't feel like it is a yeast infection.  Denies any odor.  She started having a small cramp in her right side last week, comes/goes.  She hasn't had any periods since ablation. Only rarely has had any issues with discharge, until recently.  Normal bowels, no nausea, vomiting. No urinary symptoms No hot flashes or night sweats.  Last A1c was 6 months ago: 6.6% in 03/12/21. She is under the care of Dr. Talmage Nap.  Sugars are running 100-120's in the morning. Not checking after meals often, but 160's, always <200. Denies any medication changes. She is on Janumet, never taken Farxiga/Jardiance.  PMH, PSH, SH reviewed She is s/p appendectomy.  Outpatient Encounter Medications as of 09/09/2021  Medication Sig Note   cholecalciferol (VITAMIN D) 1000 UNITS tablet Take 1,000 Units by mouth daily. Reported on 02/24/2016    Continuous Blood Gluc Sensor (FREESTYLE LIBRE 14 DAY SENSOR) MISC Apply topically every 14 (fourteen) days.    fluconazole (DIFLUCAN) 150 MG tablet Take 1 tablet (150 mg total) by mouth once for 1 dose. Take 1 tablet by mouth for yeast infection.  Repeat in 1 week if needed    JANUMET XR 50-1000 MG TB24 Take 1 tablet by mouth 2 (two) times daily with a meal. 02/24/2016: Received from: External Pharmacy   Lactobacillus (ACIDOPHILUS/BIFIDUS PO) Take 1 tablet by mouth daily.    simvastatin (ZOCOR) 20 MG tablet Take 20 mg by mouth every evening.    [DISCONTINUED] Probiotic Product (CULTURELLE PROBIOTICS PO)     ALPRAZolam (XANAX) 0.25 MG tablet Take 1-2 tablets (0.25-0.5 mg total) by mouth 3 (three) times daily as needed for anxiety.  (Patient not taking: Reported on 09/09/2021)    No facility-administered encounter medications on file as of 09/09/2021.   Not taking diflucan prior to today's visit.  Allergies  Allergen Reactions   Minocycline Rash   ROS: no fever, chills, URI symptoms, chest pain, shortness of breath.  Intermittent RLQ pain and vaginal discharge and itch per HPI. No polydipsia, polyuria, no n/v/d or constipation.  She admits she doesn't drink enough water.  No dysuria, hematuria. See HPI.  PHYSICAL EXAM:  BP 112/70   Pulse 72   Ht 5\' 4"  (1.626 m)   Wt 158 lb 12.8 oz (72 kg)   BMI 27.26 kg/m  Wt Readings from Last 3 Encounters:  09/09/21 158 lb 12.8 oz (72 kg)  07/14/21 155 lb 12.8 oz (70.7 kg)  05/12/21 154 lb (69.9 kg)   Well-appearing, pleasant female, in no distress HEENT: conjunctiva and sclera are clear, EOMI, wearing mask Neck: no lymphadenopathy or mass Heart: regular rate and rhythm Lungs: clear bilaterally Back: no CVA tenderness Abdomen: soft, mildly tender in RLQ, no mass, rebound or guarding. External genitalia: normal, no erythema, lesions or rashes.  BUS normal.  Moderate amount of thick vaginal discharge, creamy perhaps slightly yellow in color, with some chunks. No odor.  Cervix appears normal.  No CME, uterine tenderness or enlargement.  Some mild tenderness at RLQ/adnexa, but no masses Extremities: no edema Psych: normal mood, affect, hygiene and grooming Neuro: alert and oriented, normal strength, gait.  Glucose 316--ate some salsa and chips 2 hours ago.  Urine dip: SG 1.030, +glucose, trace ketones  Slight snack for lunch Bothers her, wants glu checked   ASSESSMENT/PLAN:  Vaginal yeast infection - suspect related to hyperglycemia. Treat with diflucan, hold simvastatin.  Repeat in 1 week if needed.  Work on lowering sugars. - Plan: fluconazole (DIFLUCAN) 150 MG tablet  Need for influenza vaccination - Plan: Flu Vaccine QUAD 6+ mos PF IM (Fluarix Quad  PF)  Abdominal pain, unspecified abdominal location - RLQ pain--ddx reviewed, possibly related to ovulation vs cyst.  she is s/p appendectomy.  Exam overall benign - Plan: POCT Urinalysis DIP (Proadvantage Device)  Type 2 diabetes mellitus with hyperglycemia, without long-term current use of insulin (HCC) - Will monitor sugars more frequently (postprandial), and f/u with Dr. Talmage Nap as scheduled in October. Cont Janumet  Type 2 diabetes mellitus without complication, without long-term current use of insulin (HCC) - Plan: Glucose (CBG)  Vaginal discharge - Plan: POCT Wet Prep [facility] [place])

## 2021-09-20 LAB — HEMOGLOBIN A1C: Hemoglobin A1C: 7.4

## 2021-09-20 LAB — LIPID PANEL: LDL Cholesterol: 140

## 2021-09-27 ENCOUNTER — Encounter: Payer: Self-pay | Admitting: *Deleted

## 2021-10-10 NOTE — Progress Notes (Signed)
Chief Complaint  Patient presents with   Annual Exam    Fasting annual exam, no pap-sees Vanessa [name] (faxed records request). Got stung by a yellow jacket this weekend on leg, took some benadryl-could you please look at. Patient asking if I can give her a Freestyle 3 sample.     Vanessa [name] is a 68 female who presents for a complete physical.   She has the following concerns:  She was stung on the R thigh 2 days ago by a yellow jacket--it was small at first, increasing in size, and is painful and itchy. She took 2 doses of benadryl and has been icing it. No fevers, shortness of breath or other rashes.  Diabetes:  Under the care of Dr. Chalmers [name]. A1c was 7.4 earlier this month. She was switched from Janumet to Metformin ER $RemoveBefo'750mg'ChmwMAmPDTy$  daily and Trulicity 0.$RemoveBeforeD'75mg'DDcDxsITaOTnYE$  weekly. Also was prescribed Freestyle Libre 3 monitor.  They didn't have it at the pharmacy, asking for a sample of the 3 (on last day of the 2 she has on).  Sugars have been good.  She states she had been out of town a lot over the summer, not watching diet as closely. Labs done by Dr. Chalmers [name] also showed that her LDL went from 92 to 140 on recent labs. She continues on $RemoveBefo'20mg'lnhwDMFOGLU$  of simvastatin. She had missed some doses when she had a yeast infection (stopped while on medication). She ate poorly while traveling.  Is scheduled to have this rechecked in January  She checks her feet regularly, denies lesions. Dr. Chalmers [name] did foot exam 03/2021, normal Denies polydipsia, polyuria, hypoglycemia.  Last eye exam was 10/2020. She is set to f/u with Dr. Chalmers [name] in January, and will have c-met, A1c, lipids, ferritin, Vit D and done (per scanned notes, orders entered for Jan 2023).  She is under the care of Dr. Earnstine [name] for GYN care, last seen 08/2021.   She has fibroids, and is s/p ablation.  She denies any bleeding. Denies hot flashes or night sweats.  Immunization History  Administered Date(s) Administered   Influenza Split 10/21/2009,  10/12/2011, 09/05/2012, 09/04/2014   Influenza, Quadrivalent, Recombinant, Inj, Pf 09/29/2017   Influenza,inj,Quad PF,6+ Mos 08/27/2015, 08/31/2016, 09/06/2020, 09/09/2021   Influenza-Unspecified 09/15/2019   Moderna Covid-19 Vaccine Bivalent Booster 25yrs & up 10/11/2021   Moderna Sars-Covid-2 Vaccination 02/20/2020, 03/27/2020, 12/06/2020   Pneumococcal Conjugate-13 10/02/2019   Pneumococcal Polysaccharide-23 07/20/2007   Td 06/30/2016   Tdap 06/18/2006   Zoster Recombinat (Shingrix) 10/15/2019, 12/18/2019   Last Pap smear: 08/2020 normal (by GYN) Last mammogram: 05/2021 Last colonoscopy: 10/2017, normal, Dr. Collene Mares. Repeat 10 years Last DEXA: never Dentist: twice yearly Ophtho: yearly, 10/2020 Exercise: Start doing some step aerobics with her daughter at night (3 nights/week in the last 2 weeks.  Playing softball 2 days/week, but is ending soon. No weight-bearing exercise currently. Occasional cheese, yogurt, milk.  Not daily. Doesn't take any calcium supplements (just D)  PMH, PSH, SH and FH were updated and reviewed.  Outpatient Encounter Medications as of 10/11/2021  Medication Sig Note   cholecalciferol (VITAMIN D) 1000 UNITS tablet Take 1,000 Units by mouth daily. Reported on 02/24/2016    Continuous Blood Gluc Sensor (FREESTYLE LIBRE 14 DAY SENSOR) MISC Apply topically every 14 (fourteen) days.    Lactobacillus (ACIDOPHILUS/BIFIDUS PO) Take 1 tablet by mouth daily.    metFORMIN (GLUCOPHAGE-XR) 750 MG 24 hr tablet 1 tablet with evening meal    simvastatin (ZOCOR) 20 MG tablet Take 20 mg  by mouth every evening.    TRULICITY 7.61 YW/7.3XT SOPN Inject 0.75 mg into the skin once a week.    [DISCONTINUED] JANUMET XR 50-1000 MG TB24 Take 1 tablet by mouth 2 (two) times daily with a meal. 02/24/2016: Received from: External Pharmacy   [DISCONTINUED] ALPRAZolam (XANAX) 0.25 MG tablet Take 1-2 tablets (0.25-0.5 mg total) by mouth 3 (three) times daily as needed for anxiety. (Patient not  taking: Reported on 09/09/2021)    No facility-administered encounter medications on file as of 10/11/2021.   Allergies  Allergen Reactions   Minocycline Rash   ROS: The patient denies anorexia, fever, weight changes, headaches, vision changes, decreased hearing, ear pain, sore throat, breast concerns, chest pain, palpitations, dizziness, syncope, dyspnea on exertion, cough, swelling, nausea, vomiting, diarrhea, constipation, abdominal pain, melena, hematochezia, indigestion/heartburn, hematuria, incontinence, dysuria, vaginal bleeding, discharge, odor or itch, genital lesions, weakness, tremor, suspicious skin lesions, depression, anxiety, abnormal bleeding/bruising, or enlarged lymph nodes.  R foot pain since neuroma surgery; still has tingling in 3rd and 4th toes since neuroma surgery. Sees Dr. Fritzi [name]. Sting on R thigh is itchy and increasing in size.    PHYSICAL EXAM:  BP 118/68   Pulse 80   Ht $R'5\' 4"'if$  (1.626 m)   Wt 150 lb 3.2 oz (68.1 kg)   BMI 25.78 kg/m   Wt Readings from Last 3 Encounters:  10/11/21 150 lb 3.2 oz (68.1 kg)  09/09/21 158 lb 12.8 oz (72 kg)  07/14/21 155 lb 12.8 oz (70.7 kg)    General Appearance:      Alert, cooperative, no distress, appears stated age    Head:      Normocephalic, without obvious abnormality, atraumatic    Eyes:      PERRL, conjunctiva/corneas clear, EOM's intact, fundi benign    Ears:      Normal TM's and external ear canals    Nose:     Not examined, wearing mask due to COVID-19 pandemic   Throat:     Not examined, wearing mask due to COVID-19 pandemic    Neck:     Supple, no lymphadenopathy;  thyroid:  no enlargement/ tenderness/nodules; no carotid bruit or JVD    Back:      Spine nontender, no curvature, ROM normal, no CVA tenderness.   Lungs:       Clear to auscultation bilaterally without wheezes, rales or ronchi; respirations unlabored    Chest Wall:      No tenderness or deformity     Heart:      Regular rate and rhythm, S1 and S2  normal, no murmur, rub or gallop    Breast Exam:     Deferred to GYN  Abdomen:       Soft, non-tender, nondistended, normoactive bowel sounds, no masses, no hepatosplenomegaly.  Low transverse well healed incision. There are multiple hyperpigmented macules/papules noted within this skin fold.  No erythema, inflammation, rash.  Genitalia:     Deferred to GYN  Rectal:     Deferred to GYN  Extremities:     No clubbing, cyanosis or edema.  WHSS between 2nd and 3rd and 3rd and 4th toes on R, nontender, no swelling or erythema.   Pulses:     2+ and symmetric all extremities    Skin:     Skin color, texture, turgor normal, no rashes. Hyperpigmentation around sebaceous cyst (which is flat, not inflamed) at L shoulder. R thigh, anteriorly, there is a 3x3.5 cm area of induration and mild  erythema/warmth. She is scratching at this area during her visit.    Lymph nodes:     Cervical, supraclavicular, and axillary nodes normal    Neurologic:     Normal strength, sensation and gait; reflexes 2+ and symmetric throughout                          Psych:   Normal mood, affect, hygiene and grooming   ASSESSMENT/PLAN:  Annual physical exam - Plan: POCT Urinalysis DIP (Proadvantage Device)  Pure hypercholesterolemia - LDL above goal on last check. Cont statin, low cholesterol diet. If LDL >100 on recheck in 12/2021, will need higher dose or change in statin  Type 2 diabetes mellitus without complication, without long-term current use of insulin ([place]) - above goal on last chek. Sugars improved since med changes. Cont current meds, proper diet and f/u as sched with Dr. Chalmers [name] in 12/2021 - Plan: Microalbumin / creatinine urine ratio  Need for COVID-19 vaccine - Plan: Moderna Covid-19 Vaccine Bivalent Booster  Right foot pain - ongoing pain/numbness in R foot, s/p neuroma surgery x 2 by Dr. Fritzi [name].  Refer for 2nd opinion/eval/treatment to [facility] - Plan: Ambulatory referral to Podiatry  Numbness of toes - Plan:  Ambulatory referral to Podiatry   Get records from [place], seen 08/2021 Had normal diabetic foot exam from Dr. Chalmers [name] 03/2021.   Discussed monthly self breast exams and yearly mammograms; at least 30 minutes of aerobic activity at least 5 days/week, weight-bearing exercise at least 2x/week; proper sunscreen use reviewed; healthy diet, including goals of calcium and vitamin D intake and alcohol recommendations (less than or equal to 1 drink/day) reviewed; regular seatbelt use; changing batteries in smoke detectors.  Immunization recommendations discussed--bivalent COVID booster given today. Continue yearly flu shots.   Colonoscopy recommendations reviewed--UTD. Repeat in 10/2027   F/u 1 year, sooner prn. Continue regular f/u with Dr. Chalmers [name] (planned for 12/2021 with repeat labs).

## 2021-10-10 NOTE — Patient Instructions (Addendum)
  HEALTH MAINTENANCE RECOMMENDATIONS:  It is recommended that you get at least 30 minutes of aerobic exercise at least 5 days/week (for weight loss, you may need as much as 60-90 minutes). This can be any activity that gets your heart rate up. This can be divided in 10-15 minute intervals if needed, but try and build up your endurance at least once a week.  Weight bearing exercise is also recommended twice weekly.  Eat a healthy diet with lots of vegetables, fruits and fiber.  "Colorful" foods have a lot of vitamins (ie green vegetables, tomatoes, red peppers, etc).  Limit sweet tea, regular sodas and alcoholic beverages, all of which has a lot of calories and sugar.  Up to 1 alcoholic drink daily may be beneficial for women (unless trying to lose weight, watch sugars).  Drink a lot of water.  Calcium recommendations are 1200-1500 mg daily (1500 mg for postmenopausal women or women without ovaries), and vitamin D 1000 IU daily.  This should be obtained from diet and/or supplements (vitamins), and calcium should not be taken all at once, but in divided doses.  Monthly self breast exams and yearly mammograms for women over the age of 27 is recommended.  Sunscreen of at least SPF 30 should be used on all sun-exposed parts of the skin when outside between the hours of 10 am and 4 pm (not just when at beach or pool, but even with exercise, golf, tennis, and yard work!)  Use a sunscreen that says "broad spectrum" so it covers both UVA and UVB rays, and make sure to reapply every 1-2 hours.  Remember to change the batteries in your smoke detectors when changing your clock times in the spring and fall. Carbon monoxide detectors are recommended for your home.  Use your seat belt every time you are in a car, and please drive safely and not be distracted with cell phones and texting while driving.  Take claritin or zyrtec or allegra once daily to help with the reaction to the sting. You can continue to use ice,  and tylenol or ibuprofen as needed for pain.  Continue to try and limit the cholesterol in your diet (if your LDL remains over 100 on your next check, then your statin dose should be increased).

## 2021-10-11 ENCOUNTER — Encounter: Payer: BC Managed Care – PPO | Admitting: Family Medicine

## 2021-10-11 ENCOUNTER — Ambulatory Visit: Payer: BC Managed Care – PPO | Admitting: Family Medicine

## 2021-10-11 ENCOUNTER — Other Ambulatory Visit: Payer: Self-pay

## 2021-10-11 ENCOUNTER — Encounter: Payer: Self-pay | Admitting: Family Medicine

## 2021-10-11 VITALS — BP 118/68 | HR 80 | Ht 64.0 in | Wt 150.2 lb

## 2021-10-11 DIAGNOSIS — E119 Type 2 diabetes mellitus without complications: Secondary | ICD-10-CM | POA: Diagnosis not present

## 2021-10-11 DIAGNOSIS — E7800 Pure hypercholesterolemia, unspecified: Secondary | ICD-10-CM

## 2021-10-11 DIAGNOSIS — M79671 Pain in right foot: Secondary | ICD-10-CM

## 2021-10-11 DIAGNOSIS — Z0000 Encounter for general adult medical examination without abnormal findings: Secondary | ICD-10-CM | POA: Diagnosis not present

## 2021-10-11 DIAGNOSIS — R200 Anesthesia of skin: Secondary | ICD-10-CM

## 2021-10-11 DIAGNOSIS — Z23 Encounter for immunization: Secondary | ICD-10-CM | POA: Diagnosis not present

## 2021-10-11 LAB — POCT URINALYSIS DIP (PROADVANTAGE DEVICE)
Blood, UA: NEGATIVE
Glucose, UA: NEGATIVE mg/dL
Leukocytes, UA: NEGATIVE
Nitrite, UA: NEGATIVE
Specific Gravity, Urine: 1.03
Urobilinogen, Ur: NEGATIVE
pH, UA: 5.5

## 2021-10-12 LAB — MICROALBUMIN / CREATININE URINE RATIO
Creatinine, Urine: 193.1 mg/dL
Microalb/Creat Ratio: 8 mg/g creat (ref 0–29)
Microalbumin, Urine: 14.6 ug/mL

## 2021-10-18 ENCOUNTER — Ambulatory Visit: Payer: BC Managed Care – PPO | Admitting: Podiatry

## 2021-10-18 ENCOUNTER — Ambulatory Visit (INDEPENDENT_AMBULATORY_CARE_PROVIDER_SITE_OTHER): Payer: BC Managed Care – PPO

## 2021-10-18 ENCOUNTER — Encounter: Payer: Self-pay | Admitting: Podiatry

## 2021-10-18 ENCOUNTER — Other Ambulatory Visit: Payer: Self-pay

## 2021-10-18 DIAGNOSIS — M2041 Other hammer toe(s) (acquired), right foot: Secondary | ICD-10-CM

## 2021-10-18 DIAGNOSIS — M778 Other enthesopathies, not elsewhere classified: Secondary | ICD-10-CM

## 2021-10-18 MED ORDER — TRIAMCINOLONE ACETONIDE 10 MG/ML IJ SUSP
20.0000 mg | Freq: Once | INTRAMUSCULAR | Status: AC
  Administered 2021-10-18: 20 mg

## 2021-10-18 NOTE — Progress Notes (Signed)
Subjective:   Patient ID: Vanessa [name], female   DOB: 55 y.o.   MRN: [id number]   HPI Patient states she had neuromas removed about 3 years ago from her right forefoot and its given her no relief and she gets a lot of pain when she tries to walk on her foot and I have treated her approximately 7 years ago.  States it is quite sore at all times and getting worse and patient does not smoke likes to be active   Review of Systems  All other systems reviewed and are negative.      Objective:  Physical Exam Vitals and nursing note reviewed.  Constitutional:      Appearance: She is well-developed.  Pulmonary:     Effort: Pulmonary effort is normal.  Musculoskeletal:        General: Normal range of motion.  Skin:    General: Skin is warm.  Neurological:     Mental Status: She is alert.    Neurovascular status intact muscle strength found to be adequate range of motion adequate patient found to have exquisite discomfort second and third metatarsal phalangeal joint left with inflammation fluid of the joint surfaces.  Distal joint third digit right also painful with swelling of the joint and inability to wear shoe gear without discomfort     Assessment:  Probability that were dealing with an inflammatory capsulitis versus history of neuroma second third interspace right foot.  I do think it affects the joints and also that there may be arthritis of the distal third digit right     Plan:  H&P both conditions reviewed and today I did anesthetized with 100 mg like Marcaine mixture forefoot aspirated the second and third right getting out a small amount of clear fluid injected with quarter cc dexamethasone Kenalog discussed possible surgery in future but I want to see response to conservative treatment first.  Reappoint 3 weeks to recheck  X-rays indicate there is mild elongation of the third and second metatarsal segment right no indication arthritis stress fracture

## 2021-11-22 ENCOUNTER — Encounter: Payer: Self-pay | Admitting: Family Medicine

## 2021-11-22 ENCOUNTER — Telehealth (INDEPENDENT_AMBULATORY_CARE_PROVIDER_SITE_OTHER): Payer: BC Managed Care – PPO | Admitting: Family Medicine

## 2021-11-22 VITALS — Ht 64.0 in | Wt 157.0 lb

## 2021-11-22 DIAGNOSIS — R051 Acute cough: Secondary | ICD-10-CM | POA: Diagnosis not present

## 2021-11-22 DIAGNOSIS — J069 Acute upper respiratory infection, unspecified: Secondary | ICD-10-CM

## 2021-11-22 MED ORDER — BENZONATATE 200 MG PO CAPS
200.0000 mg | ORAL_CAPSULE | Freq: Three times a day (TID) | ORAL | 0 refills | Status: DC | PRN
Start: 2021-11-22 — End: 2022-05-02

## 2021-11-22 NOTE — Patient Instructions (Signed)
Stay well hydrated. Continue Mucinex DM twice daily (if needed for cough, any thick mucus or phlegm). Use the benzonatate (tessalon) if needed for cough.  Hopefully this will help you sleep better at night. You may use this up to three times/day.  Contact us if you have persistent/worsening symptoms--including fever, recurrent/persistent discolored mucus or phlegm, pain with breathing, shortness of breath, or other concerns.  I hope you feel better soon!

## 2021-11-22 NOTE — Progress Notes (Signed)
Start time:  11:53 End time: 12:05  Virtual Visit via Video Note  I connected with Vanessa [name] on 11/22/21 by a video enabled telemedicine application and verified that I am speaking with the correct person using two identifiers.  Location: Patient: office Provider: office   I discussed the limitations of evaluation and management by telemedicine and the availability of in person appointments. The patient expressed understanding and agreed to proceed.  History of Present Illness:  Chief Complaint  Patient presents with   Sore Throat    VIRTUAL sore throat, cough and congestion. No fever, chills or body aches. Started last Monday. Taking 12hr mucinex DM BID and tylenol. Took a covid test on Friday and it was negative.   Symptoms started 11/28 with sore throat, coughing up thick green mucus. She also had a headache (didn't think it was sinus HA). She then started taking mucinex DM twice daily and tylenol (for headaches).  Didn't have any body aches. She was feeling better by Friday. She had a negative COVID test that day. Saturday night things got worse--she had coughed all night. It was dry/hacky.  Cough is sometimes productive--it isn't as dark as it had been, now more creamy-yellow. Felt the same yesterday. She is feeling somewhat better today. She continues to take Mucinex DM.  No known fevers.  Her husband also had a sore throat, had been with him last weekend. They both started with ST on Monday. No other sick contacts.  PMH, PSH, SH reviewed  Outpatient Encounter Medications as of 11/22/2021  Medication Sig Note   acetaminophen (TYLENOL) 500 MG tablet Take 1,000 mg by mouth every 6 (six) hours as needed. 11/22/2021: Last dose last night   benzonatate (TESSALON) 200 MG capsule Take 1 capsule (200 mg total) by mouth 3 (three) times daily as needed for cough.    cholecalciferol (VITAMIN D) 1000 UNITS tablet Take 1,000 Units by mouth daily. Reported on 02/24/2016     Continuous Blood Gluc Sensor (FREESTYLE LIBRE 14 DAY SENSOR) MISC Apply topically every 14 (fourteen) days.    dextromethorphan-guaiFENesin (MUCINEX DM) 30-600 MG 12hr tablet Take 1 tablet by mouth 2 (two) times daily. 11/22/2021: Last dose this 7am   Lactobacillus (ACIDOPHILUS/BIFIDUS PO) Take 1 tablet by mouth daily.    simvastatin (ZOCOR) 20 MG tablet Take 20 mg by mouth every evening.    sitaGLIPtin-metformin (JANUMET) 50-1000 MG tablet Janumet 50 mg-1,000 mg tablet  Take 1 tablet twice a day by oral route.    [DISCONTINUED] metFORMIN (GLUCOPHAGE-XR) 750 MG 24 hr tablet 1 tablet with evening meal    meloxicam (MOBIC) 7.5 MG tablet Take 7.5 mg by mouth daily. (Patient not taking: Reported on 11/22/2021)    [DISCONTINUED] simvastatin (ZOCOR) 20 MG tablet Take 1 tablet by mouth daily.    [DISCONTINUED] TRULICITY 0.75 MG/0.5ML SOPN Inject 0.75 mg into the skin once a week. (Patient not taking: Reported on 11/22/2021)    No facility-administered encounter medications on file as of 11/22/2021.   (NOT taking tessalon prior to today)  Allergies  Allergen Reactions   Minocycline Rash    ROS:  URI symptoms per HPI. No fever, shortness of breath, chest pain, n/v/d, rash or other concerns.     Observations/Objective:  Ht 5\' 4"  (1.626 m)   Wt 157 lb (71.2 kg)   BMI 26.95 kg/m   Well-appearing, pleasant female, with occasional cough during visit. She is alert and oriented, speaking easily, in no distress Cranial nerves grossly intact. Normal grooming.  Exam is limited due to virtual nature of the visit   Assessment and Plan:  Viral upper respiratory tract infection - supportive measures reviewed; reviewed s/sx bacterial infection and to contact us if sx persist/worsen  Acute cough - Plan: benzonatate (TESSALON) 200 MG capsule  Continue Mucinex DM  twice daily Stay well hydrated  Refill benzonatate Contact us if worsening   Follow Up Instructions:    I discussed the  assessment and treatment plan with the patient. The patient was provided an opportunity to ask questions and all were answered. The patient agreed with the plan and demonstrated an understanding of the instructions.   The patient was advised to call back or seek an in-person evaluation if the symptoms worsen or if the condition fails to improve as anticipated.  I spent 15 minutes dedicated to the care of this patient, including pre-visit review of records, face to face time, post-visit ordering of testing and documentation.    Lavonda Jumbo, MD

## 2021-11-25 ENCOUNTER — Encounter: Payer: BC Managed Care – PPO | Admitting: Family Medicine

## 2021-11-30 ENCOUNTER — Telehealth: Payer: Self-pay | Admitting: Family Medicine

## 2021-11-30 MED ORDER — AZITHROMYCIN 250 MG PO TABS: ORAL_TABLET | ORAL | 0 refills | Status: DC

## 2021-11-30 NOTE — Telephone Encounter (Signed)
Rx sent to pharmacy; sent pt message letting her know.

## 2021-11-30 NOTE — Telephone Encounter (Signed)
Pt called and said she was told to call back if her symptoms did not improve. She said she still feels bad and wants to see if she can get an antibiotic called in.

## 2021-12-09 ENCOUNTER — Ambulatory Visit: Payer: BC Managed Care – PPO | Admitting: Podiatry

## 2021-12-24 ENCOUNTER — Ambulatory Visit: Payer: BC Managed Care – PPO | Admitting: Podiatry

## 2022-04-21 ENCOUNTER — Ambulatory Visit: Payer: BC Managed Care – PPO | Admitting: Family Medicine

## 2022-05-01 NOTE — Progress Notes (Signed)
Chief Complaint  ?Patient presents with  ? Consult  ?  Patient has been exhausted lately, feels like she has been dragging. Has also been having some body aches, off and on. Went to give blood last Wed, HgB was 9 and she was not able to. I did POCT here and it was 11.3.  ? ?She has been feeling more tired over the last month.  She has noticed she is waking up later in the morning, chomping on ice, similar to when she was anemia related to her fibroids in the past. ? ?2 weeks ago she went to donate blood, but wasn't able to (hgb 9). ?She donates regularly, last time was in 01/2022. ? ?Someone from blood drive recommended [employer] energy vitamin.  She hasn't started it yet.  Contains B vitamins, as well as other vitamins, Ca, D, Fe, biotin. She hasn't started to take these vitamins yet. ? ?Over the last month her bowels are a little different--not as solid, more stringy/slightly looser consistency.  Not black or bloody. ?Denies nausea, vomiting, indigestion, heartburn. No abdominal pain. Denies change in appetite, weight. ? ?H/o fibroids, s/p ablation.  Under the care of [facility]. She denies any vaginal bleeding (none since 2011, the time of her ablation) . Denies hot flashes. ? ?Labs in 09/2021 by Dr. Hardin Negus 13, ferritin normal at 25.2 ? ?Colonoscopy 10/2017 normal ?EGD 03/2016 Benign-appearing esophageal stenosis. Dilated. Normal pathology ? ?DM--sugars have been better.  (According to her meter, A1c down to about a 6.8, back below 7) ?Doing well on Janumet.  Tried Trulicity, but got a rash (took x 3 weeks). ? ? ?PMH, PSH, SH reviewed ? ?Outpatient Encounter Medications as of 05/02/2022  ?Medication Sig Note  ? cholecalciferol (VITAMIN D) 1000 UNITS tablet Take 1,000 Units by mouth daily. Reported on 02/24/2016   ? Continuous Blood Gluc Sensor (FREESTYLE LIBRE 14 DAY SENSOR) MISC Apply topically every 14 (fourteen) days.   ? Lactobacillus (ACIDOPHILUS/BIFIDUS PO) Take 1 tablet by mouth daily.   ?  simvastatin (ZOCOR) 20 MG tablet Take 20 mg by mouth every evening.   ? sitaGLIPtin-metformin (JANUMET) 50-1000 MG tablet Janumet 50 mg-1,000 mg tablet ? Take 1 tablet twice a day by oral route.   ? acetaminophen (TYLENOL) 500 MG tablet Take 1,000 mg by mouth every 6 (six) hours as needed. (Patient not taking: Reported on 05/02/2022) 05/02/2022: prn  ? [DISCONTINUED] azithromycin (ZITHROMAX) 250 MG tablet Take 2 tablets by mouth on first day, then 1 tablet by mouth on days 2 through 5   ? [DISCONTINUED] benzonatate (TESSALON) 200 MG capsule Take 1 capsule (200 mg total) by mouth 3 (three) times daily as needed for cough.   ? [DISCONTINUED] dextromethorphan-guaiFENesin (MUCINEX DM) 30-600 MG 12hr tablet Take 1 tablet by mouth 2 (two) times daily. 11/22/2021: Last dose this 7am  ? [DISCONTINUED] meloxicam (MOBIC) 7.5 MG tablet Take 7.5 mg by mouth daily. (Patient not taking: Reported on 11/22/2021)   ? ?No facility-administered encounter medications on file as of 05/02/2022.  ? ?Allergies  ?Allergen Reactions  ? Minocycline Rash  ? ? ?ROS: No fever or chills, no URI symptoms. ?Feels slightly achey when she feels very tired. ?No bleeding, bruising, rashes. No GI or GU complaints, as per HPI. ?No chest pain, shortness of breath.  Hasn't noticed any change in her softball related to her fatigue, no DOE. ? ? ?PHYSICAL EXAM: ? ?BP 110/70   Pulse 84   Ht $R'5\' 4"'SY$  (1.626 m)   Wt 151  lb (68.5 kg)   BMI 25.92 kg/m?  ? ?Wt Readings from Last 3 Encounters:  ?05/02/22 151 lb (68.5 kg)  ?11/22/21 157 lb (71.2 kg)  ?10/11/21 150 lb 3.2 oz (68.1 kg)  ? ?Pleasant, well-appearing female, in good spirits. ?HEENT: conjunctiva and sclera are clear, EOMI, OP clear ?Neck: no lymphadenopathy or mass ?Heart: regular rate and rhythm, no murmur ?Lungs: clear bilaterally ?Abdomen: soft, nontender, no organomegaly or mass ?Extremities: no edema ?Psych: normal mood, affect, hygiene and grooming ?Neuro: alert and oriented, cranial nerves grossly  intact, normal gait. ? ?FS Hg 11.3 ? ? ?ASSESSMENT/PLAN: ? ?Fatigue, unspecified type - low Hgb 2 wks ago, better here, still a little low; no known blood loss. Check labs  - Plan: Comprehensive metabolic panel, CBC with Differential/Platelet, TSH, VITAMIN D 25 Hydroxy (Vit-D Deficiency, Fractures), Iron, TIBC and Ferritin Panel, Vitamin B12 ? ?Low hemoglobin - no menstrual bleeding. If found to be iron deficiency, will refer back to Dr. Collene Mares for further eval; no GI sx - Plan: Hemoglobin, Iron, TIBC and Ferritin Panel, Vitamin B12 ? ?Anemia, unspecified type - Plan: CBC with Differential/Platelet, Iron, TIBC and Ferritin Panel, Vitamin B12 ? ?Vitamin D deficiency - compliant with supplements; given fatigue, will recheck levels - Plan: VITAMIN D 25 Hydroxy (Vit-D Deficiency, Fractures) ? ?Medication monitoring encounter - Plan: Comprehensive metabolic panel, CBC with Differential/Platelet, TSH, VITAMIN D 25 Hydroxy (Vit-D Deficiency, Fractures), Iron, TIBC and Ferritin Panel, Vitamin B12 ? ?Hypothyroidism, unspecified type - under care of Dr. Chalmers Cater.  Recheck due to worsening fatigue - Plan: TSH ? ?Cbc, iron panel ?B12, (pt on metformin) ?C-met(nonfasting) ?TSH, D ? ?I spent 32 minutes dedicated to the care of this patient, including pre-visit review of records, face to face time, post-visit ordering of testing and documentation. ? ?

## 2022-05-02 ENCOUNTER — Encounter: Payer: Self-pay | Admitting: Family Medicine

## 2022-05-02 ENCOUNTER — Ambulatory Visit: Payer: BC Managed Care – PPO | Admitting: Family Medicine

## 2022-05-02 VITALS — BP 110/70 | HR 84 | Ht 64.0 in | Wt 151.0 lb

## 2022-05-02 DIAGNOSIS — E559 Vitamin D deficiency, unspecified: Secondary | ICD-10-CM | POA: Diagnosis not present

## 2022-05-02 DIAGNOSIS — R5383 Other fatigue: Secondary | ICD-10-CM | POA: Diagnosis not present

## 2022-05-02 DIAGNOSIS — Z5181 Encounter for therapeutic drug level monitoring: Secondary | ICD-10-CM

## 2022-05-02 DIAGNOSIS — D649 Anemia, unspecified: Secondary | ICD-10-CM | POA: Diagnosis not present

## 2022-05-02 DIAGNOSIS — E039 Hypothyroidism, unspecified: Secondary | ICD-10-CM

## 2022-05-02 LAB — POCT HEMOGLOBIN: Hemoglobin: 11.3 g/dL (ref 11–14.6)

## 2022-05-02 NOTE — Patient Instructions (Signed)
We will be in touch with recommendations once your labs are back tomorrow. ? ?

## 2022-05-03 LAB — CBC WITH DIFFERENTIAL/PLATELET
Basophils Absolute: 0 10*3/uL (ref 0.0–0.2)
Basos: 1 %
EOS (ABSOLUTE): 0.1 10*3/uL (ref 0.0–0.4)
Eos: 1 %
Hematocrit: 35.8 % (ref 34.0–46.6)
Hemoglobin: 11.5 g/dL (ref 11.1–15.9)
Immature Grans (Abs): 0 10*3/uL (ref 0.0–0.1)
Immature Granulocytes: 0 %
Lymphocytes Absolute: 1.7 10*3/uL (ref 0.7–3.1)
Lymphs: 32 %
MCH: 25.3 pg — ABNORMAL LOW (ref 26.6–33.0)
MCHC: 32.1 g/dL (ref 31.5–35.7)
MCV: 79 fL (ref 79–97)
Monocytes Absolute: 0.3 10*3/uL (ref 0.1–0.9)
Monocytes: 6 %
Neutrophils Absolute: 3.1 10*3/uL (ref 1.4–7.0)
Neutrophils: 60 %
Platelets: 343 10*3/uL (ref 150–450)
RBC: 4.55 x10E6/uL (ref 3.77–5.28)
RDW: 16 % — ABNORMAL HIGH (ref 11.7–15.4)
WBC: 5.2 10*3/uL (ref 3.4–10.8)

## 2022-05-03 LAB — COMPREHENSIVE METABOLIC PANEL
ALT: 13 IU/L (ref 0–32)
AST: 17 IU/L (ref 0–40)
Albumin/Globulin Ratio: 1.7 (ref 1.2–2.2)
Albumin: 4.3 g/dL (ref 3.8–4.9)
Alkaline Phosphatase: 56 IU/L (ref 44–121)
BUN/Creatinine Ratio: 14 (ref 9–23)
BUN: 10 mg/dL (ref 6–24)
Bilirubin Total: 0.6 mg/dL (ref 0.0–1.2)
CO2: 21 mmol/L (ref 20–29)
Calcium: 9.4 mg/dL (ref 8.7–10.2)
Chloride: 104 mmol/L (ref 96–106)
Creatinine, Ser: 0.69 mg/dL (ref 0.57–1.00)
Globulin, Total: 2.5 g/dL (ref 1.5–4.5)
Glucose: 190 mg/dL — ABNORMAL HIGH (ref 70–99)
Potassium: 4.5 mmol/L (ref 3.5–5.2)
Sodium: 141 mmol/L (ref 134–144)
Total Protein: 6.8 g/dL (ref 6.0–8.5)
eGFR: 103 mL/min/{1.73_m2} (ref >59)

## 2022-05-03 LAB — IRON,TIBC AND FERRITIN PANEL
Ferritin: 12 ng/mL — ABNORMAL LOW (ref 15–150)
Iron Saturation: 10 % — ABNORMAL LOW (ref 15–55)
Iron: 40 ug/dL (ref 27–159)
Total Iron Binding Capacity: 398 ug/dL (ref 250–450)
UIBC: 358 ug/dL (ref 131–425)

## 2022-05-03 LAB — VITAMIN D 25 HYDROXY (VIT D DEFICIENCY, FRACTURES): Vit D, 25-Hydroxy: 49.3 ng/mL (ref 30.0–100.0)

## 2022-05-03 LAB — VITAMIN B12: Vitamin B-12: 268 pg/mL (ref 232–1245)

## 2022-05-03 LAB — TSH: TSH: 0.822 u[IU]/mL (ref 0.450–4.500)

## 2022-05-18 ENCOUNTER — Other Ambulatory Visit: Payer: Self-pay | Admitting: Family Medicine

## 2022-05-18 DIAGNOSIS — Z1231 Encounter for screening mammogram for malignant neoplasm of breast: Secondary | ICD-10-CM

## 2022-05-20 ENCOUNTER — Ambulatory Visit
Admission: RE | Admit: 2022-05-20 | Discharge: 2022-05-20 | Disposition: A | Discharge status: Home / Self Care | Payer: BC Managed Care – PPO | Source: Ambulatory Visit | Attending: Family Medicine | Admitting: Family Medicine

## 2022-05-20 DIAGNOSIS — Z1231 Encounter for screening mammogram for malignant neoplasm of breast: Secondary | ICD-10-CM

## 2022-05-24 ENCOUNTER — Other Ambulatory Visit: Payer: Self-pay | Admitting: Family Medicine

## 2022-05-24 DIAGNOSIS — R928 Other abnormal and inconclusive findings on diagnostic imaging of breast: Secondary | ICD-10-CM

## 2022-05-31 ENCOUNTER — Ambulatory Visit: Payer: BC Managed Care – PPO

## 2022-05-31 ENCOUNTER — Other Ambulatory Visit: Payer: Self-pay | Admitting: Family Medicine

## 2022-05-31 ENCOUNTER — Ambulatory Visit
Admission: RE | Admit: 2022-05-31 | Discharge: 2022-05-31 | Disposition: A | Discharge status: Home / Self Care | Payer: BC Managed Care – PPO | Source: Ambulatory Visit | Attending: Family Medicine | Admitting: Family Medicine

## 2022-05-31 DIAGNOSIS — R928 Other abnormal and inconclusive findings on diagnostic imaging of breast: Secondary | ICD-10-CM

## 2022-07-21 ENCOUNTER — Ambulatory Visit: Payer: BC Managed Care – PPO | Admitting: Family Medicine

## 2022-07-21 ENCOUNTER — Encounter: Payer: Self-pay | Admitting: Family Medicine

## 2022-07-21 VITALS — BP 120/68 | HR 76 | Temp 98.0°F | Ht 64.0 in | Wt 150.4 lb

## 2022-07-21 DIAGNOSIS — R21 Rash and other nonspecific skin eruption: Secondary | ICD-10-CM | POA: Diagnosis not present

## 2022-07-21 MED ORDER — TRIAMCINOLONE ACETONIDE 0.1 % EX CREA: 1.0000 | TOPICAL_CREAM | Freq: Two times a day (BID) | CUTANEOUS | 0 refills | Status: DC | PRN

## 2022-07-21 NOTE — Progress Notes (Signed)
Chief Complaint  Patient presents with   Rash    Rash on neck that is itchy, started 1-2 weeks ago. Felt like she was having trouble swallowing at first as well, that has gone now. Used cortisone and neosporin.    2-3 weeks ago she had some trouble swallowing (unsure if related to thyroid or allergic reaction), very short-lived. After that she noticed a rash on the right side of her neck. It started as one patch, spread to 3. Admits to scratching at it. Using an anti-itch cream (thinks possibly hydrocortisone)--helped temporarily.  Forgets to bring it at work, and is scratching again at the end of the day. She also has tried some neosporin.  No new products/exposures. Doing some yardwork. She started jardiance, no other changes..  Denies vaginal discharge, itch or urinary complaints.  Sugars are improved since starting this.  PMH, PSH, SH reviewed  Outpatient Encounter Medications as of 07/21/2022  Medication Sig Note   cholecalciferol (VITAMIN D) 1000 UNITS tablet Take 1,000 Units by mouth daily. Reported on 02/24/2016    Continuous Blood Gluc Sensor (FREESTYLE LIBRE 14 DAY SENSOR) MISC Apply topically every 14 (fourteen) days.    simvastatin (ZOCOR) 20 MG tablet Take 20 mg by mouth every evening.    sitaGLIPtin-metformin (JANUMET) 50-1000 MG tablet Janumet 50 mg-1,000 mg tablet  Take 1 tablet twice a day by oral route.    triamcinolone cream (KENALOG) 0.1 % Apply 1 Application topically 2 (two) times daily as needed (itchy rash). Apply sparingly to affected area of skin for up to 2 weeks.    acetaminophen (TYLENOL) 500 MG tablet Take 1,000 mg by mouth every 6 (six) hours as needed. (Patient not taking: Reported on 05/02/2022) 05/02/2022: prn   JARDIANCE 10 MG TABS tablet Take 10 mg by mouth daily.    Lactobacillus (ACIDOPHILUS/BIFIDUS PO) Take 1 tablet by mouth daily. (Patient not taking: Reported on 07/21/2022)    No facility-administered encounter medications on file as of 07/21/2022.    Allergies  Allergen Reactions   Minocycline Rash   ROS: no fever, chills, URI symptoms.  Swallowing issue was very short-lived, hasn't recurred. No SOB, wheezing. No vaginal discharge, odor, itch or urinary complaints.  No rash elsewhere or other skin concerns.  PHYSICAL EXAM:  BP 120/68   Pulse 76   Temp 98 F (36.7 C) (Tympanic)   Ht 5\' 4"  (1.626 m)   Wt 150 lb 6.4 oz (68.2 kg)   BMI 25.82 kg/m   Pleasant, well-appearing female, in good spirits HEENT: conjunctiva and sclera are clear, EOMI. Neck: no lymphadenopathy or mass Skin on neck:  3 linear areas along the right side of the neck--superiormost is somewhat more midline.  These areas are hyperpigmented, slightly dry/scaly. They are fairly flat  ASSESSMENT/PLAN:  Rash of neck - unclear etiology, suspect contact derm. Trial TAC BID; f/u if persists/worsens - Plan: triamcinolone cream (KENALOG) 0.1 %

## 2022-07-21 NOTE — Patient Instructions (Signed)
Use the prescribed steroid cream in place of the over-the-counter hydrocortisone. Use it sparingly, twice daily until the rash has resolved.  Don't use it for longer than 2 weeks (should be better by that point, follow up if it isn't). Never use this cream on your face (only over-the-counter hydrocortisone) in case it spreads there.

## 2022-08-11 ENCOUNTER — Ambulatory Visit: Payer: BC Managed Care – PPO | Admitting: Family Medicine

## 2022-08-11 ENCOUNTER — Encounter: Payer: Self-pay | Admitting: Family Medicine

## 2022-08-11 VITALS — BP 110/70 | HR 66 | Wt 148.4 lb

## 2022-08-11 DIAGNOSIS — M7918 Myalgia, other site: Secondary | ICD-10-CM

## 2022-08-11 DIAGNOSIS — R1310 Dysphagia, unspecified: Secondary | ICD-10-CM

## 2022-08-11 MED ORDER — MELOXICAM 15 MG PO TABS: 15.0000 mg | ORAL_TABLET | Freq: Every day | ORAL | 0 refills | Status: DC

## 2022-08-11 NOTE — Progress Notes (Signed)
Chief Complaint  Patient presents with   Acute Visit    Pt has been having mid back pain. She has been having issues with her throat to where it feels like her food gets stuck.   She thought she was going to choke on a bite of chicken the other night.  Got stuck in her chest, patted on her chest, finally went down. She has h/o stricture and dilation by Dr. Loreta Ave in the past.  Starting to feel it a little more recently.  Can be fine for weeks, is sporadic. Denies heartburn. Today she feels like there is a lot of mucus/PND. Thinks that may be contributing to her swallowing difficulty.  Second concern is that of back pain.  She has been having back pain off/on x 6 months. Pain is at L inner portion of the shoulder blade. Sometimes hurts with a deep breath. Comes and goes.  It was flaring over the past weekend. No known fall/injury or change in activity.  Used a roller on it, may have helped a little. Hasn't taken any other medications or tried heat or ice. She is worried about it being her lungs. Denies any breathing issues.   PMH, PSH, SH reviewed  Outpatient Encounter Medications as of 08/11/2022  Medication Sig   cholecalciferol (VITAMIN D) 1000 UNITS tablet Take 1,000 Units by mouth daily. Reported on 02/24/2016   Continuous Blood Gluc Sensor (FREESTYLE LIBRE 14 DAY SENSOR) MISC Apply topically every 14 (fourteen) days.   JARDIANCE 10 MG TABS tablet Take 10 mg by mouth daily.   simvastatin (ZOCOR) 20 MG tablet Take 20 mg by mouth every evening.   sitaGLIPtin-metformin (JANUMET) 50-1000 MG tablet Janumet 50 mg-1,000 mg tablet  Take 1 tablet twice a day by oral route.   acetaminophen (TYLENOL) 500 MG tablet Take 1,000 mg by mouth every 6 (six) hours as needed. (Patient not taking: Reported on 05/02/2022)   Lactobacillus (ACIDOPHILUS/BIFIDUS PO) Take 1 tablet by mouth daily. (Patient not taking: Reported on 07/21/2022)   [DISCONTINUED] triamcinolone cream (KENALOG) 0.1 % Apply 1 Application  topically 2 (two) times daily as needed (itchy rash). Apply sparingly to affected area of skin for up to 2 weeks. (Patient not taking: Reported on 08/11/2022)   No facility-administered encounter medications on file as of 08/11/2022.   Allergies  Allergen Reactions   Minocycline Rash   ROS:  No f/c, cough or shortness of breath. No chest pain. No numbness, tingling, no abdominal pain. Intermittent dysphagia per HPI. R sided thoracic back pain per HPI. Sugars are improved since starting jardiance   PHYSICAL EXAM:  BP 110/70   Pulse 66   Wt 148 lb 6.4 oz (67.3 kg)   SpO2 97%   BMI 25.47 kg/m   Pleasant, well-appearing female in no distress HEENT: conjunctiva and sclera are clear, EOMI Neck: nBorderline thyroid size, no nodules, no lymphadenopathy Back: no spinal tenderness. She is tender at L rhomboid. No significant spasm. Heart: regular rate and rhythm Lungs: clear bilaterally Abdomen: soft, nontender, no mass Extremities: no edema Psych: normal mood, affect, hygiene and grooming Neuro: alert and oriented, cranial nerves grossly intact, normal strength, gait.  ASSESSMENT/PLAN:  Rhomboid muscle pain - shown stretches; heat, massage. ice after activity.  Trial of NSAIDs (risks/SE reviewed); consider PT or muscle relaxants if not improving - Plan: meloxicam (MOBIC) 15 MG tablet  Dysphagia, unspecified type - If from PND, treat allergies and mucinex.  Small bites, lots of water. f/u with Dr. Loreta Ave if persistent dysphagia  Drink plenty of water. Be sure to take small bites and chew your food well.  Avoid dry foods. Since postnasal drainage may be a factor, you can try taking some mucinex (as well as allergy medications (claritin, zyrtec, etc). If you note any heartburn, taking prilosec OTC once daily would be a good idea.  Sometimes strictures are caused by acid reflux. If you have persistent swallowing troubles, you should follow up with Dr. Natasha Mead may need to have either a  swallow study or another endoscopy with possible dilatation if there is another stricture.  Your rhomboid muscle is what is hurting. Use ice after activity if it is hurting. Otherwise, you moist heat. Continue to try massage and do the stretches as shown (squeezing shoulders together while leaning forward). Take the anti-inflammatory pill once daily with food.  Take this until your pain has resolved (likely 7-10 days. You may use it for the full 15 days, if needed. Physical therapy and/or muscle relaxants may be needed if not getting better (especially if you feel a "knot" where it hurts.  Don't use any ibuprofen/advil/motrin/aleve, naproxen or BC/Goody powder while taking the anti-inflammatory. You may use tylenol if needed for pain.

## 2022-08-11 NOTE — Patient Instructions (Signed)
  Drink plenty of water. Be sure to take small bites and chew your food well.  Avoid dry foods. Since postnasal drainage may be a factor, you can try taking some mucinex (as well as allergy medications (claritin, zyrtec, etc). If you note any heartburn, taking prilosec OTC once daily would be a good idea.  Sometimes strictures are caused by acid reflux. If you have persistent swallowing troubles, you should follow up with Dr. Natasha Mead may need to have either a swallow study or another endoscopy with possible dilatation if there is another stricture.  Your rhomboid muscle is what is hurting. Use ice after activity if it is hurting. Otherwise, you moist heat. Continue to try massage and do the stretches as shown (squeezing shoulders together while leaning forward). Take the anti-inflammatory pill once daily with food.  Take this until your pain has resolved (likely 7-10 days. You may use it for the full 15 days, if needed. Physical therapy and/or muscle relaxants may be needed if not getting better (especially if you feel a "knot" where it hurts.  Don't use any ibuprofen/advil/motrin/aleve, naproxen or BC/Goody powder while taking the anti-inflammatory. You may use tylenol if needed for pain.

## 2022-08-24 ENCOUNTER — Encounter: Payer: Self-pay | Admitting: Internal Medicine

## 2022-09-06 ENCOUNTER — Encounter: Payer: Self-pay | Admitting: Family Medicine

## 2022-09-27 ENCOUNTER — Other Ambulatory Visit: Payer: Self-pay | Admitting: Podiatry

## 2022-09-27 ENCOUNTER — Other Ambulatory Visit (HOSPITAL_COMMUNITY): Payer: Self-pay | Admitting: Podiatry

## 2022-09-27 DIAGNOSIS — G5781 Other specified mononeuropathies of right lower limb: Secondary | ICD-10-CM

## 2022-09-27 DIAGNOSIS — M7751 Other enthesopathy of right foot: Secondary | ICD-10-CM

## 2022-10-18 ENCOUNTER — Ambulatory Visit (HOSPITAL_COMMUNITY): Payer: BC Managed Care – PPO

## 2022-10-18 NOTE — Progress Notes (Addendum)
Chief Complaint  Patient presents with   Annual Exam    Fasting annual exam. Has not seen GYN since 9/22. Had recent eye exam(I will get). Saw Dr Chalmers Cater recently-she is pulling up labs on her phone. Last A1c 06/10/22, would like one today. Having MRI of R foot.     Vanessa [name] is a 61 female who presents for a complete physical.    Diabetes:  Under the care of Dr. Chalmers Cater. She is on [place].  Last A1c was elevated, prior to starting Jardiance. She states that sugars are improved.  Currently glu 129 per her [facility], has been 92% in target in the last 2 weeks. This monitor helps her with accountability. She checks her feet regularly, denies lesions. She sees podiatrist in [place], has MRI scheduled, as she continues to have R foot pain (had prior surgeries). Denies polydipsia, polyuria, hypoglycemia.  Gets some low warnings at night, but checks it with glucometer is normal, thinks is related to her laying on it. Last eye exam was 2 weeks ago.  Hyperlipidemia:  LDL was up last year, when not eating as well related to travel, and stopped while on antifungals. LDL was 140 in 09/2021. This is monitored by Dr. Chalmers Cater. We have not gotten any lab results or records. She continues on 20mg  of simvastatin.  She is under the care of Dr. [name] for GYN care.  Last seen 08/2021. She has fibroids, and is s/p ablation.  She denies any bleeding. Denies hot flashes or night sweats. Denies vaginal dryness or discomfort.  She was noted to have low ferritin on labs in May, borderline low Hgb.  She has h/o blood donation (but wasn't able to donate due to Hgb).  She was advised to take iron supplements after labs in May, for a couple of weeks before and a couple of weeks after donating blood. She hasn't donated any blood since then.  She took some iron every other day (to avoid constipation), for about a month, not recently.  Lab Results  Component Value Date   WBC 5.2  05/02/2022   HGB 11.5 05/02/2022   HCT 35.8 05/02/2022   MCV 79 05/02/2022   PLT 343 05/02/2022   Lab Results  Component Value Date   IRON 40 05/02/2022   TIBC 398 05/02/2022   FERRITIN 12 (L) 05/02/2022   She was noted to have borderline low B12 levels, when seen in May and complaining of fatigue.  B12 supplement was recommended. She doesn't recall this, hasn't been taking any B12.  Lab Results  Component Value Date   VITAMINB12 268 05/02/2022   H/o vitamin D deficiency.  Last level was 49.3 in May.  Immunization History  Administered Date(s) Administered   Influenza Split 10/21/2009, 10/12/2011, 09/05/2012, 09/04/2014   Influenza, Quadrivalent, Recombinant, Inj, Pf 09/29/2017   Influenza,inj,Quad PF,6+ Mos 08/27/2015, 08/31/2016, 09/06/2020, 09/09/2021   Influenza-Unspecified 09/15/2019   Moderna Covid-19 Vaccine Bivalent Booster 56yrs & up 10/11/2021   Moderna Sars-Covid-2 Vaccination 02/20/2020, 03/27/2020, 12/06/2020   Pneumococcal Conjugate-13 10/02/2019   Pneumococcal Polysaccharide-23 07/20/2007   Td 06/30/2016   Tdap 06/18/2006   Zoster Recombinat (Shingrix) 10/15/2019, 12/18/2019   Last Pap smear: 08/2020 normal (by GYN) Last mammogram: 05/2022 Last colonoscopy: 10/2017, normal, Dr. Collene Mares. Repeat 10 years Last DEXA: never Dentist: twice yearly Ophtho: yearly, went recently. Exercise: Nothing currently.  Softball ended 1-2 weeks ago, and was playing twice a week. She walks on campus daily, at least 10 minutes.  No weight-bearing exercise.  Cheese toast for breakfast (thin-sliced multigrain bread).  Occasional yogurt, milk.  Not daily. Doesn't take any calcium supplements (just D)  PMH, PSH, SH and FH were updated and reviewed.  Outpatient Encounter Medications as of 10/19/2022  Medication Sig Note   cholecalciferol (VITAMIN D) 1000 UNITS tablet Take 1,000 Units by mouth daily. Reported on 02/24/2016    Continuous Blood Gluc Sensor (FREESTYLE LIBRE 14 DAY SENSOR)  MISC Apply topically every 14 (fourteen) days.    JARDIANCE 10 MG TABS tablet Take 10 mg by mouth daily.    simvastatin (ZOCOR) 20 MG tablet Take 20 mg by mouth every evening.    sitaGLIPtin-metformin (JANUMET) 50-1000 MG tablet Janumet 50 mg-1,000 mg tablet  Take 1 tablet twice a day by oral route.    acetaminophen (TYLENOL) 500 MG tablet Take 1,000 mg by mouth every 6 (six) hours as needed. (Patient not taking: Reported on 05/02/2022) 10/19/2022: prn   Lactobacillus (ACIDOPHILUS/BIFIDUS PO) Take 1 tablet by mouth daily. (Patient not taking: Reported on 07/21/2022)    [DISCONTINUED] meloxicam (MOBIC) 15 MG tablet Take 1 tablet (15 mg total) by mouth daily. Take with food until pain has resolved    No facility-administered encounter medications on file as of 10/19/2022.   Allergies  Allergen Reactions   Minocycline Rash   Trulicity [Dulaglutide] Rash    ROS: The patient denies anorexia, fever, weight changes, headaches, vision changes, decreased hearing, ear pain, sore throat, breast concerns, chest pain, palpitations, dizziness, syncope, dyspnea on exertion, cough, swelling, nausea, vomiting, diarrhea, constipation, abdominal pain, melena, hematochezia, indigestion/heartburn, hematuria, incontinence, dysuria, vaginal bleeding, discharge, odor or itch, genital lesions, weakness, tremor, suspicious skin lesions, depression, anxiety, abnormal bleeding/bruising, or enlarged lymph nodes.  Some L knee pain and bilateral hip pain.  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  Seeing new podiatrist.  She had a bad fall playing softball in early October, bruised her R hip/buttock. Completely resolved, able to play the next day. Continued hyperpigmentation on the R neck.  No longer itchy.   PHYSICAL EXAM:  BP 120/70   Pulse 72   Ht 5\' 4"  (1.626 m)   Wt 149 lb 9.6 oz (67.9 kg)   BMI 25.68 kg/m   Wt Readings from Last 3 Encounters:  10/19/22 149 lb 9.6 oz (67.9 kg)   08/11/22 148 lb 6.4 oz (67.3 kg)  07/21/22 150 lb 6.4 oz (68.2 kg)    General Appearance:      Alert, cooperative, no distress, appears stated age    Head:      Normocephalic, without obvious abnormality, atraumatic    Eyes:      PERRL, conjunctiva/corneas clear, EOM's intact, fundi benign    Ears:      Normal TM's and external ear canals    Nose:     No drainage or sinus tenderness  Throat:     Normal mucosa, no lesions  Neck:     Supple, no lymphadenopathy;  thyroid:  no enlargement/ tenderness/nodules; no carotid bruit or JVD    Back:      Spine nontender, no curvature, ROM normal, no CVA tenderness.   Lungs:       Clear to auscultation bilaterally without wheezes, rales or ronchi; respirations unlabored    Chest Wall:      No tenderness or deformity     Heart:      Regular rate and rhythm, S1 and S2 normal, no murmur, rub  or gallop    Breast Exam:     No nipple inversion or discharge. No breast masses or tenderness. No axillary lymphadenopathy. Benign nevi around L areola (largest is superiorly), and some hyperpigmention in skin fold. No significant skin tags.  Abdomen:       Soft, non-tender, nondistended, normoactive bowel sounds, no masses, no hepatosplenomegaly.  Low transverse well healed incision. There are multiple hyperpigmented macules/papules noted within this skin fold.  No erythema, inflammation, rash.  Genitalia:     Normal external genitalia, without lesions. BUS and vagina normal. Normal bimanual exam--no cervical motion tenderness, uterus and adnexa normal, nontender, not enlarged.  Rectal:     Normal sphincter tone, no masses.  Heme negative stool  Extremities:     No clubbing, cyanosis or edema.  WHSS between 2nd and 3rd and 3rd and 4th toes on R, nontender, no swelling or erythema. Normal sensation to monofilament.  Pulses:     2+ and symmetric all extremities    Skin:     Skin color, texture, turgor normal, no rashes.  L posterior shoulder with WHSS (no longer has  cyst). Hyperpigmentation at the R anterior neck  Lymph nodes:     Cervical, supraclavicular, inguinal and axillary nodes normal    Neurologic:     Normal strength, sensation and gait; reflexes 2+ and symmetric throughout                          Psych:   Normal mood, affect, hygiene and grooming  Normal diabetic foot exam.  Lab Results  Component Value Date   HGBA1C 6.2 (A) 10/19/2022   Labs received from Dr. Talmage Nap before end of visit-- 06/10/22 Chem normal except fasting glu 105. A1c 7.1% TC 156, TG 49, HDL 58, LDL 88 Vitamin D 53.5  ASSESSMENT/PLAN:  Annual physical exam - Plan: POCT Urinalysis DIP (Proadvantage Device), Microalbumin / creatinine urine ratio, Vitamin B12, Ferritin, CBC with Differential/Platelet  Pure hypercholesterolemia - lipids at goal per June labs by Dr. Talmage Nap. Cont simvastatin, low cholesterol diet  Type 2 diabetes mellitus without complication, without long-term current use of insulin (HCC) - improved control since Jardiance was added, and using [employer]. - Plan: HgB A1c, Microalbumin / creatinine urine ratio  Vitamin D deficiency - continue current supplements  Anemia, unspecified type - due for recheck.  Hasn't donated blood in a while. Not currently taking iron - Plan: Vitamin B12, Ferritin, CBC with Differential/Platelet  B12 deficiency - hasn't been taking supplements. Recheck - Plan: Vitamin B12, CBC with Differential/Platelet  Medication monitoring encounter  Need for influenza vaccination - Plan: Pfizer Fall 2023 Covid-19 Vaccine 80yrs and older  Need for COVID-19 vaccine - Plan: Flu Vaccine QUAD 6+ mos PF IM (Fluarix Quad PF)  Recommended f/u with derm regarding rash on neck, if it continues to spread and/or doesn't start to fade.  No longer itchy, so suspect is hyperpigmented from prior inflammation   Notes and lab results from Dr. Talmage Nap received at end of visit. No need to recheck lipids or chem. Cbc, urine microalbumin, B12,  ferritin  Doesn't want to go back to Dr. Lowell Guitar.  Prefers to come here. Decided late in visit.  Breast/pelvic done today, will do pap next year.  Discussed monthly self breast exams and yearly mammograms; at least 30 minutes of aerobic activity at least 5 days/week, weight-bearing exercise at least 2x/week; proper sunscreen use reviewed; healthy diet, including goals of calcium and vitamin D  intake and alcohol recommendations (less than or equal to 1 drink/day) reviewed; regular seatbelt use; changing batteries in smoke detectors.   Immunization recommendations discussed--flu shot and bivalent COVID booster given today.  Colonoscopy recommendations reviewed--UTD. Repeat in 10/2027   F/u 1 year, sooner prn.Marland Kitchen

## 2022-10-18 NOTE — Patient Instructions (Signed)

## 2022-10-19 ENCOUNTER — Ambulatory Visit (INDEPENDENT_AMBULATORY_CARE_PROVIDER_SITE_OTHER): Payer: BC Managed Care – PPO | Admitting: Family Medicine

## 2022-10-19 ENCOUNTER — Encounter: Payer: Self-pay | Admitting: Family Medicine

## 2022-10-19 VITALS — BP 120/70 | HR 72 | Ht 64.0 in | Wt 149.6 lb

## 2022-10-19 DIAGNOSIS — D649 Anemia, unspecified: Secondary | ICD-10-CM

## 2022-10-19 DIAGNOSIS — E119 Type 2 diabetes mellitus without complications: Secondary | ICD-10-CM | POA: Diagnosis not present

## 2022-10-19 DIAGNOSIS — E7800 Pure hypercholesterolemia, unspecified: Secondary | ICD-10-CM | POA: Diagnosis not present

## 2022-10-19 DIAGNOSIS — Z5181 Encounter for therapeutic drug level monitoring: Secondary | ICD-10-CM

## 2022-10-19 DIAGNOSIS — E559 Vitamin D deficiency, unspecified: Secondary | ICD-10-CM

## 2022-10-19 DIAGNOSIS — E538 Deficiency of other specified B group vitamins: Secondary | ICD-10-CM

## 2022-10-19 DIAGNOSIS — Z0000 Encounter for general adult medical examination without abnormal findings: Secondary | ICD-10-CM | POA: Diagnosis not present

## 2022-10-19 DIAGNOSIS — Z23 Encounter for immunization: Secondary | ICD-10-CM

## 2022-10-19 LAB — POCT URINALYSIS DIP (PROADVANTAGE DEVICE)
Bilirubin, UA: NEGATIVE
Blood, UA: NEGATIVE
Glucose, UA: >=1000 mg/dL — AB
Leukocytes, UA: NEGATIVE
Nitrite, UA: NEGATIVE
Protein Ur, POC: NEGATIVE mg/dL
Specific Gravity, Urine: 1.025
Urobilinogen, Ur: 0.2
pH, UA: 6 (ref 5.0–8.0)

## 2022-10-19 LAB — POCT GLYCOSYLATED HEMOGLOBIN (HGB A1C): Hemoglobin A1C: 6.2 % — AB (ref 4.0–5.6)

## 2022-10-20 LAB — FERRITIN: Ferritin: 20 ng/mL (ref 15–150)

## 2022-10-20 LAB — CBC WITH DIFFERENTIAL/PLATELET
Basophils Absolute: 0 10*3/uL (ref 0.0–0.2)
Basos: 1 %
EOS (ABSOLUTE): 0.1 10*3/uL (ref 0.0–0.4)
Eos: 1 %
Hematocrit: 41.1 % (ref 34.0–46.6)
Hemoglobin: 13.7 g/dL (ref 11.1–15.9)
Immature Grans (Abs): 0 10*3/uL (ref 0.0–0.1)
Immature Granulocytes: 0 %
Lymphocytes Absolute: 1.4 10*3/uL (ref 0.7–3.1)
Lymphs: 27 %
MCH: 27.5 pg (ref 26.6–33.0)
MCHC: 33.3 g/dL (ref 31.5–35.7)
MCV: 82 fL (ref 79–97)
Monocytes Absolute: 0.4 10*3/uL (ref 0.1–0.9)
Monocytes: 7 %
Neutrophils Absolute: 3.3 10*3/uL (ref 1.4–7.0)
Neutrophils: 64 %
Platelets: 344 10*3/uL (ref 150–450)
RBC: 4.99 x10E6/uL (ref 3.77–5.28)
RDW: 15.3 % (ref 11.7–15.4)
WBC: 5.1 10*3/uL (ref 3.4–10.8)

## 2022-10-20 LAB — VITAMIN B12: Vitamin B-12: 266 pg/mL (ref 232–1245)

## 2022-10-20 LAB — MICROALBUMIN / CREATININE URINE RATIO
Creatinine, Urine: 99.5 mg/dL
Microalb/Creat Ratio: 4 mg/g creat (ref 0–29)
Microalbumin, Urine: 3.8 ug/mL

## 2022-11-15 LAB — VITAMIN D 25 HYDROXY (VIT D DEFICIENCY, FRACTURES): Vit D, 25-Hydroxy: 54.9

## 2022-11-15 LAB — LIPID PANEL: LDL Cholesterol: 85

## 2022-11-24 ENCOUNTER — Encounter: Payer: Self-pay | Admitting: *Deleted

## 2023-01-06 ENCOUNTER — Ambulatory Visit (HOSPITAL_COMMUNITY)
Admission: RE | Admit: 2023-01-06 | Discharge: 2023-01-06 | Disposition: A | Discharge status: Home / Self Care | Payer: BC Managed Care – PPO | Source: Ambulatory Visit | Attending: Podiatry | Admitting: Podiatry

## 2023-01-06 DIAGNOSIS — G5781 Other specified mononeuropathies of right lower limb: Secondary | ICD-10-CM | POA: Insufficient documentation

## 2023-01-06 DIAGNOSIS — M7751 Other enthesopathy of right foot: Secondary | ICD-10-CM | POA: Diagnosis present

## 2023-03-09 ENCOUNTER — Ambulatory Visit
Admission: RE | Admit: 2023-03-09 | Discharge: 2023-03-09 | Disposition: A | Discharge status: Home / Self Care | Payer: BC Managed Care – PPO | Source: Ambulatory Visit | Attending: Nurse Practitioner | Admitting: Nurse Practitioner

## 2023-03-09 ENCOUNTER — Telehealth: Payer: BC Managed Care – PPO | Admitting: Family Medicine

## 2023-03-09 VITALS — BP 126/82 | HR 92 | Resp 16

## 2023-03-09 DIAGNOSIS — J029 Acute pharyngitis, unspecified: Secondary | ICD-10-CM | POA: Insufficient documentation

## 2023-03-09 DIAGNOSIS — Z1152 Encounter for screening for COVID-19: Secondary | ICD-10-CM | POA: Insufficient documentation

## 2023-03-09 DIAGNOSIS — B349 Viral infection, unspecified: Secondary | ICD-10-CM | POA: Diagnosis not present

## 2023-03-09 LAB — POCT INFLUENZA A/B
Influenza A, POC: NEGATIVE
Influenza B, POC: NEGATIVE

## 2023-03-09 LAB — POCT RAPID STREP A (OFFICE): Rapid Strep A Screen: NEGATIVE

## 2023-03-09 MED ORDER — FLUTICASONE PROPIONATE 50 MCG/ACT NA SUSP: 2.0000 | Freq: Every day | NASAL | 0 refills | Status: DC

## 2023-03-09 MED ORDER — CETIRIZINE HCL 10 MG PO TABS: 10.0000 mg | ORAL_TABLET | Freq: Every day | ORAL | 0 refills | Status: DC

## 2023-03-09 NOTE — ED Provider Notes (Signed)
[facility]    CSN: [id number] Arrival date & time: 03/09/23  [id number]      History   Chief Complaint Chief Complaint  Patient presents with   Appointment    0930   Sore Throat         HPI Vanessa [name] is a 57 y.o. female.   The history is provided by the patient.   The patient presents for complaints of sore throat and headache that started over the past 24 hours.  She denies fever, chills, ear pain, cough, abdominal pain, nausea, vomiting, or diarrhea.  Patient states that she does have underlying nasal congestion and runny nose related to her seasonal allergies.  She is unsure if she has had any known sick contacts, but states that she does work at a [employer].  She has been taking Robitussin and ibuprofen for her symptoms which give her some relief.  Past Medical History:  Diagnosis Date   Acne 07/2009   s/p Accutane (derm in [place])   Allergy    Anemia 11/09   iron deficient--resolved after ablation   Carpal tunnel syndrome, bilateral    h/o   Diabetes mellitus    Dysphagia    s/p esophageal dilatation 04/2016   Fibrocystic breast 11/2009   biopsy R breast    H/O menorrhagia    Hyperlipidemia    Trochanteric bursitis 10/09   right   Vitamin D deficiency     Patient Active Problem List   Diagnosis Date Noted   Left hip pain 09/02/2016   Tendinopathy right shoudler 09/02/2016   Pure hypercholesterolemia 10/12/2011   Type 2 diabetes mellitus without complication ([place]) 0000000   Vitamin D deficiency 10/12/2011    Past Surgical History:  Procedure Laterality Date   APPENDECTOMY  1988   BREAST BIOPSY Right pt unsure   benign   CARPAL TUNNEL RELEASE  R 12/08, L 2003   bilateral, Dr. Amedeo Plenty   CESAREAN SECTION  2002, 2005   x 2   COLONOSCOPY  2011   ENDOMETRIAL ABLATION  09/2010   Dr. Leo Grosser   NEUROMA SURGERY Right 11/2018   x 2; Dr. Servando Salina (2-3rd and 3-4th toes)   TUBAL LIGATION  2005    OB History      Gravida  2   Para  2   Term  2   Preterm      AB      Living  2      SAB      IAB      Ectopic      Multiple      Live Births               Home Medications    Prior to Admission medications   Medication Sig Start Date End Date Taking? Authorizing Provider  cetirizine (ZYRTEC) 10 MG tablet Take 1 tablet (10 mg total) by mouth daily. 03/09/23  Yes Amjad Fikes-Warren, Alda Lea, NP  fluticasone (FLONASE) 50 MCG/ACT nasal spray Place 2 sprays into both nostrils daily. 03/09/23  Yes Denijah Karrer-Warren, Alda Lea, NP  guaifenesin (ROBITUSSIN) 100 MG/5ML syrup Take 200 mg by mouth 3 (three) times daily as needed for cough.   Yes [provider]  ibuprofen (ADVIL) 200 MG tablet Take 200 mg by mouth every 6 (six) hours as needed.   Yes [provider]  acetaminophen (TYLENOL) 500 MG tablet Take 1,000 mg by mouth every 6 (six) hours as needed. Patient not taking: Reported  on 05/02/2022    [provider]  cholecalciferol (VITAMIN D) 1000 UNITS tablet Take 1,000 Units by mouth daily. Reported on 02/24/2016    [provider]  Continuous Blood Gluc Sensor (FREESTYLE LIBRE 14 DAY SENSOR) MISC Apply topically every 14 (fourteen) days. 06/29/20   [provider]  JARDIANCE 10 MG TABS tablet Take 10 mg by mouth daily. 07/01/22   [provider]  Lactobacillus (ACIDOPHILUS/BIFIDUS PO) Take 1 tablet by mouth daily. Patient not taking: Reported on 07/21/2022    [provider]  simvastatin (ZOCOR) 20 MG tablet Take 20 mg by mouth every evening.    [provider]  sitaGLIPtin-metformin (JANUMET) 50-1000 MG tablet Janumet 50 mg-1,000 mg tablet  Take 1 tablet twice a day by oral route.    [provider]    Family History Family History  Problem Relation Age of Onset   Heart disease Father        MI in 46's   Diabetes Father    Hypertension Brother    Hypercholesterolemia Brother    Diabetes Maternal Grandmother     Diabetes Maternal Grandfather    Prostate cancer Paternal Grandfather    Multiple sclerosis Maternal Aunt    Multiple sclerosis Other    Colon cancer Neg Hx    Esophageal cancer Neg Hx    Pancreatic cancer Neg Hx    Rectal cancer Neg Hx    Stomach cancer Neg Hx    Breast cancer Neg Hx     Social History Social History   Tobacco Use   Smoking status: Never   Smokeless tobacco: Never  Vaping Use   Vaping Use: Never used  Substance Use Topics   Alcohol use: Not Currently   Drug use: No     Allergies   Minocycline and Trulicity [dulaglutide]   Review of Systems Review of Systems Per HPI  Physical Exam Triage Vital Signs ED Triage Vitals  Enc Vitals Group     BP 03/09/23 0934 126/82     Pulse Rate 03/09/23 0934 92     Resp 03/09/23 0934 16     Temp --      Temp Source 03/09/23 0934 Oral     SpO2 03/09/23 0934 97 %     Weight --      Height --      Head Circumference --      Peak Flow --      Pain Score 03/09/23 0937 8     Pain Loc --      Pain Edu? --      Excl. in [place]? --    No data found.  Updated Vital Signs BP 126/82 (BP Location: Right Arm)   Pulse 92   Resp 16   SpO2 97%   Visual Acuity Right Eye Distance:   Left Eye Distance:   Bilateral Distance:    Right Eye Near:   Left Eye Near:    Bilateral Near:     Physical Exam Vitals and nursing note reviewed.  Constitutional:      General: She is not in acute distress.    Appearance: She is well-developed.  HENT:     Head: Normocephalic.     Right Ear: Tympanic membrane and ear canal normal.     Left Ear: Tympanic membrane and ear canal normal.     Nose: No congestion.     Mouth/Throat:     Mouth: Mucous membranes are moist.     Pharynx: Pharyngeal  swelling and posterior oropharyngeal erythema present. No oropharyngeal exudate.     Comments: Cobblestoning present on posterior oropharnyx Eyes:     Conjunctiva/sclera: Conjunctivae normal.     Pupils: Pupils are equal, round, and reactive  to light.  Cardiovascular:     Rate and Rhythm: Normal rate and regular rhythm.     Heart sounds: Normal heart sounds.  Abdominal:     General: Bowel sounds are normal.     Palpations: Abdomen is soft.     Tenderness: There is no abdominal tenderness.  Musculoskeletal:     Cervical back: Normal range of motion.  Skin:    General: Skin is warm and dry.  Neurological:     General: No focal deficit present.     Mental Status: She is alert and oriented to person, place, and time.  Psychiatric:        Mood and Affect: Mood normal.        Behavior: Behavior normal.      UC Treatments / Results  Labs (all labs ordered are listed, but only abnormal results are displayed) Labs Reviewed  SARS CORONAVIRUS 2 (TAT 6-24 HRS)  POCT RAPID STREP A (OFFICE)  POCT INFLUENZA A/B    EKG   Radiology No results found.  Procedures Procedures (including critical care time)  Medications Ordered in UC Medications - No data to display  Initial Impression / Assessment and Plan / UC Course  I have reviewed the triage vital signs and the nursing notes.  Pertinent labs & imaging results that were available during my care of the patient were reviewed by me and considered in my medical decision making (see chart for details).  The patient is well-appearing, she is in no acute distress, vital signs are stable.  Rapid strep test was negative, influenza test was negative, COVID test is pending.  Patient is a candidate to receive Paxlovid if her COVID test is positive, she will need to hold her Zocor for 2 weeks if so.  Suspect throat symptoms may be aggravated by postnasal drip as evidenced by cobblestoning present on her oropharynx on her exam vs a viral illness.  Will have patient begin daily allergy medication to include cetirizine and fluticasone.  Patient advised to continue over-the-counter analgesics for throat pain or discomfort, warm salt water rinses, and plenty of rest.  Discussed viral  etiology with the patient, along with indications of when follow-up may be necessary.  Patient is in agreement with this plan of care and verbalizes understanding.  All questions were answered.  Patient stable for discharge.    Final Clinical Impressions(s) / UC Diagnoses   Final diagnoses:  Viral illness  Encounter for screening for COVID-19  Sore throat     Discharge Instructions      The rapid strep test and influenza test were negative.  A throat culture and COVID test are pending.  You will be contacted if your pending test results are positive.  You are able to receive Paxlovid if your COVID test is positive. Take medication as prescribed. Increase fluids and allow for plenty of rest. Warm salt water gargles 3-4 times daily to help with throat pain or discomfort. Recommend a soft diet while throat pain persist. Symptoms appear to be consistent with a viral illness.  Symptoms could last anywhere from 7 to 14 days.  If symptoms suddenly worsen before that time, or extend beyond that time, please follow-up with your primary care physician for further evaluation. Follow-up as needed.  ED Prescriptions     Medication Sig Dispense Auth. Provider   fluticasone (FLONASE) 50 MCG/ACT nasal spray Place 2 sprays into both nostrils daily. 16 g Rosaline Ezekiel-Warren, Alda Lea, NP   cetirizine (ZYRTEC) 10 MG tablet Take 1 tablet (10 mg total) by mouth daily. 30 tablet Olukemi Panchal-Warren, Alda Lea, NP      PDMP not reviewed this encounter.   Tish Men, NP 03/09/23 1034

## 2023-03-09 NOTE — Discharge Instructions (Addendum)
The rapid strep test and influenza test were negative.  A throat culture and COVID test are pending.  You will be contacted if your pending test results are positive.  You are able to receive Paxlovid if your COVID test is positive. Take medication as prescribed. Increase fluids and allow for plenty of rest. Warm salt water gargles 3-4 times daily to help with throat pain or discomfort. Recommend a soft diet while throat pain persist. Symptoms appear to be consistent with a viral illness.  Symptoms could last anywhere from 7 to 14 days.  If symptoms suddenly worsen before that time, or extend beyond that time, please follow-up with your primary care physician for further evaluation. Follow-up as needed.

## 2023-03-09 NOTE — ED Triage Notes (Signed)
Pt reports sore throat x 1 day. Robitussin and ibuprofen gives some relief.

## 2023-03-10 LAB — SARS CORONAVIRUS 2 (TAT 6-24 HRS): SARS Coronavirus 2: NEGATIVE

## 2023-03-16 ENCOUNTER — Encounter: Payer: Self-pay | Admitting: Nurse Practitioner

## 2023-03-16 ENCOUNTER — Ambulatory Visit: Payer: BC Managed Care – PPO | Admitting: Nurse Practitioner

## 2023-03-16 VITALS — BP 124/72 | HR 78 | Wt 151.2 lb

## 2023-03-16 DIAGNOSIS — Z13228 Encounter for screening for other metabolic disorders: Secondary | ICD-10-CM | POA: Insufficient documentation

## 2023-03-16 DIAGNOSIS — J069 Acute upper respiratory infection, unspecified: Secondary | ICD-10-CM

## 2023-03-16 DIAGNOSIS — B9689 Other specified bacterial agents as the cause of diseases classified elsewhere: Secondary | ICD-10-CM

## 2023-03-16 MED ORDER — FLUCONAZOLE 150 MG PO TABS: 150.0000 mg | ORAL_TABLET | Freq: Once | ORAL | 2 refills | Status: AC

## 2023-03-16 MED ORDER — PROMETHAZINE-DM 6.25-15 MG/5ML PO SYRP: 5.0000 mL | ORAL_SOLUTION | Freq: Four times a day (QID) | ORAL | 0 refills | Status: DC | PRN

## 2023-03-16 MED ORDER — ALBUTEROL SULFATE HFA 108 (90 BASE) MCG/ACT IN AERS: 2.0000 | INHALATION_SPRAY | Freq: Four times a day (QID) | RESPIRATORY_TRACT | 0 refills | Status: DC | PRN

## 2023-03-16 MED ORDER — AZITHROMYCIN 250 MG PO TABS: ORAL_TABLET | ORAL | 0 refills | Status: AC

## 2023-03-16 NOTE — Progress Notes (Signed)
Tollie EthSara E Damir Leung, DNP, AGNP-c [facility] [address] [place], [place] [id number] [phone]  Subjective:   Vanessa DodgeGwendolyn Deloris Nicolette BangScott [name] is a [age] y.o. female presents to day for evaluation of: Vanessa DodgeGwendolyn presents today with a chief complaint of a persistent cough that has been disturbing her sleep. Accompanying symptoms include a sore throat, headache, nasal congestion, and a runny nose, which began over a week ago. She sought care at an urgent care facility where she tested negative for flu, COVID, and strep.  The patient's symptoms have persisted, with a particularly severe sore throat that has made swallowing difficult and caused significant pain. She reports coughing up dark mucus and suspects an infection. The patient's condition has left her feeling generally unwell since the symptoms started.  She notes that her husband has been ill for three weeks, and she believes she contracted the illness from him during their recent trip to [place]. Currently, the patient is not working and has taken time off to focus on recovery. She also mentions that she and her husband have initiated lifestyle changes, such as adopting a healthier diet and working on weight loss.  The patient expresses a desire to recover in time for a wedding she plans to attend in two weeks.  PMH, Medications, and Allergies reviewed and updated in chart as appropriate.   ROS negative except for what is listed in HPI. Objective:  BP 124/72   Pulse 78   Wt 151 lb 3.2 oz (68.6 kg)   BMI 25.95 kg/m  Physical Exam Vitals and nursing note reviewed.  Constitutional:      Appearance: She is ill-appearing.  HENT:     Head: Normocephalic.     Right Ear: Hearing normal. A middle ear effusion is present.     Left Ear: Hearing normal. A middle ear effusion is present.     Nose: Congestion and rhinorrhea present.     Right Turbinates: Enlarged and swollen.     Left Turbinates: Enlarged and swollen.      Mouth/Throat:     Mouth: Mucous membranes are moist.     Pharynx: Uvula midline. Posterior oropharyngeal erythema present. No pharyngeal swelling, oropharyngeal exudate or uvula swelling.     Tonsils: No tonsillar exudate.  Eyes:     Pupils: Pupils are equal, round, and reactive to light.  Cardiovascular:     Rate and Rhythm: Normal rate and regular rhythm.     Pulses: Normal pulses.     Heart sounds: Normal heart sounds.  Pulmonary:     Effort: Pulmonary effort is normal.     Breath sounds: Wheezing present.  Abdominal:     General: Bowel sounds are normal.     Palpations: Abdomen is soft.  Lymphadenopathy:     Cervical: Cervical adenopathy present.  Skin:    General: Skin is warm and dry.     Capillary Refill: Capillary refill takes less than 2 seconds.  Neurological:     General: No focal deficit present.     Mental Status: She is alert.  Psychiatric:        Mood and Affect: Mood normal.           Assessment & Plan:   Problem List Items Addressed This Visit     Bacterial URI - Primary    The patient is experiencing a persistent cough, with a history of sore throat, headache, and nasal congestion. There has been recent exposure to a sick family member. Tests for Flu, COVID, and  strep have returned negative. Her cough is disruptive to her sleep. She does have wheezing and crackles present in both lungs.  Plan: - Prescribe a 5-day course of Azithromycin (Z-pack). - Cough syrup has been provided for sleep, this may cause drowsiness so please use caution. ' - An albuterol inhaler has also been ordered for the wheezing.  - Advise the patient to stay hydrated and rest. - Schedule a follow-up if symptoms do not improve by Pervis Macintyre next week.      Relevant Medications   promethazine-dextromethorphan (PROMETHAZINE-DM) 6.25-15 MG/5ML syrup   albuterol (VENTOLIN HFA) 108 (90 Base) MCG/ACT inhaler      Tollie Eth, DNP, AGNP-c 03/27/2023  8:02 PM    History, Medications,  Surgery, SDOH, and Family History reviewed and updated as appropriate.

## 2023-03-16 NOTE — Patient Instructions (Signed)
If you are not better into next week, please let us know.

## 2023-03-27 DIAGNOSIS — B9689 Other specified bacterial agents as the cause of diseases classified elsewhere: Secondary | ICD-10-CM | POA: Insufficient documentation

## 2023-03-27 NOTE — Assessment & Plan Note (Signed)
The patient is experiencing a persistent cough, with a history of sore throat, headache, and nasal congestion. There has been recent exposure to a sick family member. Tests for Flu, COVID, and strep have returned negative. Her cough is disruptive to her sleep. She does have wheezing and crackles present in both lungs.  Plan: - Prescribe a 5-day course of Azithromycin (Z-pack). - Cough syrup has been provided for sleep, this may cause drowsiness so please use caution. ' - An albuterol inhaler has also been ordered for the wheezing.  - Advise the patient to stay hydrated and rest. - Schedule a follow-up if symptoms do not improve by Dakarri Kessinger next week.

## 2023-04-07 ENCOUNTER — Other Ambulatory Visit: Payer: Self-pay | Admitting: Nurse Practitioner

## 2023-04-07 ENCOUNTER — Other Ambulatory Visit: Payer: Self-pay | Admitting: Family Medicine

## 2023-04-07 DIAGNOSIS — Z1231 Encounter for screening mammogram for malignant neoplasm of breast: Secondary | ICD-10-CM

## 2023-04-07 DIAGNOSIS — B9689 Other specified bacterial agents as the cause of diseases classified elsewhere: Secondary | ICD-10-CM

## 2023-04-07 NOTE — Telephone Encounter (Signed)
Pt does not need a  refill on this

## 2023-05-18 LAB — COMPREHENSIVE METABOLIC PANEL
A1c: 7
eGFR: 94

## 2023-05-18 LAB — LIPID PANEL
Cholesterol: 170 (ref 0–200)
HDL: 63 (ref 35–70)
LDL Cholesterol: 96
Triglycerides: 56 (ref 40–160)

## 2023-05-18 LAB — MICROALBUMIN / CREATININE URINE RATIO: Microalb Creat Ratio: 6

## 2023-05-18 LAB — PROTEIN / CREATININE RATIO, URINE: Creatinine, Urine: 89.3

## 2023-05-18 LAB — BASIC METABOLIC PANEL: Creatinine: 0.8 (ref 0.5–1.1)

## 2023-05-18 LAB — MICROALBUMIN, URINE
Creatinine, POC: 89.3 mg/dL
Microalb, Ur: 5

## 2023-06-02 ENCOUNTER — Ambulatory Visit
Admission: RE | Admit: 2023-06-02 | Discharge: 2023-06-02 | Disposition: A | Discharge status: Home / Self Care | Payer: BC Managed Care – PPO | Source: Ambulatory Visit | Attending: Family Medicine | Admitting: Family Medicine

## 2023-06-02 DIAGNOSIS — Z1231 Encounter for screening mammogram for malignant neoplasm of breast: Secondary | ICD-10-CM

## 2023-06-05 ENCOUNTER — Telehealth: Payer: Self-pay | Admitting: Family Medicine

## 2023-06-05 DIAGNOSIS — F40243 Fear of flying: Secondary | ICD-10-CM

## 2023-06-05 MED ORDER — ALPRAZOLAM 0.25 MG PO TABS: 0.2500 mg | ORAL_TABLET | Freq: Three times a day (TID) | ORAL | 0 refills | Status: DC | PRN

## 2023-06-05 NOTE — Telephone Encounter (Signed)
Pt advised.

## 2023-06-05 NOTE — Telephone Encounter (Signed)
Advise pt that I refilled the #10 alprazolam (last rx'd in 06/2021) for her to use. Remind her not to mix it with any alcohol or drive while taking this medication.

## 2023-06-05 NOTE — Telephone Encounter (Signed)
Pt called stating Dr. Lynelle Doctor gave her some meds  a few years ago before a trip for being anxious/nervous about flying. She has another trip coming up and wants to get rx again

## 2023-08-23 LAB — LAB REPORT - SCANNED
A1c: 7.1
EGFR: 99

## 2023-09-08 LAB — HEMOGLOBIN A1C
EGFR: 92
Hemoglobin A1C: 6.7

## 2023-10-03 ENCOUNTER — Encounter: Payer: Self-pay | Admitting: *Deleted

## 2023-10-03 LAB — HM DIABETES EYE EXAM

## 2023-10-17 ENCOUNTER — Other Ambulatory Visit (HOSPITAL_BASED_OUTPATIENT_CLINIC_OR_DEPARTMENT_OTHER): Payer: Self-pay | Admitting: Endocrinology

## 2023-10-17 DIAGNOSIS — E1165 Type 2 diabetes mellitus with hyperglycemia: Secondary | ICD-10-CM

## 2023-11-01 ENCOUNTER — Encounter: Payer: Self-pay | Admitting: *Deleted

## 2023-11-09 ENCOUNTER — Ambulatory Visit (HOSPITAL_BASED_OUTPATIENT_CLINIC_OR_DEPARTMENT_OTHER)
Admission: RE | Admit: 2023-11-09 | Discharge: 2023-11-09 | Disposition: A | Discharge status: Home / Self Care | Payer: BC Managed Care – PPO | Source: Ambulatory Visit | Attending: Endocrinology | Admitting: Endocrinology

## 2023-11-09 DIAGNOSIS — E1165 Type 2 diabetes mellitus with hyperglycemia: Secondary | ICD-10-CM | POA: Insufficient documentation

## 2023-11-14 NOTE — Patient Instructions (Incomplete)

## 2023-11-14 NOTE — Progress Notes (Unsigned)
No chief complaint on file.   Vanessa [name] is a 52 female who presents for a complete physical.    Diabetes:  Under the care of Dr. Talmage Nap. She is on [place].  Last A1c was 6.7 in 08/2023 on this regimen. She checks her feet regularly, denies lesions.  She sees podiatrist in [place], has MRI scheduled, as she continues to have R foot pain (had prior surgeries). Denies polydipsia, polyuria, hypoglycemia.  Gets some low warnings at night, but checks it with glucometer is normal, thinks is related to her laying on it. Last eye exam was ***  Hyperlipidemia:  She continues on 20mg  of simvastatin. Labs are monitored by Dr. Talmage Nap.  Last check was in May, TC 170, TG 56, HDL 63, LDL 96. She continues to follow low cholesterol diet. She had coronary calcium scan done last week, ordered by Dr. Talmage Nap: IMPRESSION: Coronary calcium score of 35.6. This was 74 percentile for age-, race-, and sex-matched controls.  She is under the care of Henreitta Leber for GYN care.  Last seen  ***UPDATE She has fibroids, and is s/p ablation.  She denies any bleeding. Denies hot flashes or night sweats. Denies vaginal dryness or discomfort.   She was noted to have borderline low B12 levels, when seen in May 2023 (level of 268) and complaining of fatigue.  B12 supplement was recommended. Last check was at physical last year, hadn't been taking any supplement, and B12 was low at 266. She is currently taking   H/o vitamin D deficiency.  Last level was 49.3 in May 2023. She continues to take daily supplement of 1000 IU.  Immunization History  Administered Date(s) Administered   Influenza Split 10/21/2009, 10/12/2011, 09/05/2012, 09/04/2014   Influenza, Quadrivalent, Recombinant, Inj, Pf 09/29/2017   Influenza,inj,Quad PF,6+ Mos 08/27/2015, 08/31/2016, 09/06/2020, 09/09/2021, 10/19/2022   Influenza-Unspecified 09/15/2019, 09/20/2023   Moderna Covid-19 Vaccine Bivalent Booster 81yrs & up  10/11/2021   Moderna Sars-Covid-2 Vaccination 02/20/2020, 03/27/2020, 12/06/2020   Pfizer(Comirnaty)Fall Seasonal Vaccine 12 years and older 10/19/2022   Pneumococcal Conjugate-13 10/02/2019   Pneumococcal Polysaccharide-23 07/20/2007   Td 06/30/2016   Tdap 06/18/2006   Zoster Recombinant(Shingrix) 10/15/2019, 12/18/2019   Last Pap smear: 08/2020 normal (by GYN) Last mammogram: 05/2023 Last colonoscopy: 10/2017, normal, Dr. Loreta Ave. Repeat 10 years Last DEXA: never Dentist: twice yearly Ophtho: yearly, went recently. Exercise:   Nothing currently.  Softball ended 1-2 weeks ago, and was playing twice a week. She walks on campus daily, at least 10 minutes.  No weight-bearing exercise.  Cheese toast for breakfast (thin-sliced multigrain bread).  Occasional yogurt, milk.  Not daily. Doesn't take any calcium supplements (just D)  PMH, PSH, SH and FH were updated and reviewed.    ROS: The patient denies anorexia, fever, weight changes, headaches, vision changes, decreased hearing, ear pain, sore throat, breast concerns, chest pain, palpitations, dizziness, syncope, dyspnea on exertion, cough, swelling, nausea, vomiting, diarrhea, constipation, abdominal pain, melena, hematochezia, indigestion/heartburn, hematuria, incontinence, dysuria, vaginal bleeding, discharge, odor or itch, genital lesions, weakness, tremor, suspicious skin lesions, depression, anxiety, abnormal bleeding/bruising, or enlarged lymph nodes.   Some L knee pain and bilateral hip pain.  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  Seeing new podiatrist.    PHYSICAL EXAM:  There were no vitals taken for this visit.  Wt Readings from Last 3 Encounters:  03/16/23 151 lb 3.2 oz (68.6 kg)  10/19/22 149 lb 9.6 oz (67.9 kg)  08/11/22 148 lb 6.4 oz (67.3 kg)    General Appearance:      Alert, cooperative, no distress, appears stated age    Head:      Normocephalic, without obvious  abnormality, atraumatic    Eyes:      PERRL, conjunctiva/corneas clear, EOM's intact, fundi benign    Ears:      Normal TM's and external ear canals    Nose:     No drainage or sinus tenderness  Throat:     Normal mucosa, no lesions  Neck:     Supple, no lymphadenopathy;  thyroid:  no enlargement/ tenderness/nodules; no carotid bruit or JVD    Back:      Spine nontender, no curvature, ROM normal, no CVA tenderness.   Lungs:       Clear to auscultation bilaterally without wheezes, rales or ronchi; respirations unlabored    Chest Wall:      No tenderness or deformity     Heart:      Regular rate and rhythm, S1 and S2 normal, no murmur, rub or gallop    Breast Exam:     No nipple inversion or discharge. No breast masses or tenderness. No axillary lymphadenopathy. Benign nevi around L areola (largest is superiorly), and some hyperpigmention in skin fold. No significant skin tags.  Abdomen:       Soft, non-tender, nondistended, normoactive bowel sounds, no masses, no hepatosplenomegaly.  Low transverse well healed incision. There are multiple hyperpigmented macules/papules noted within this skin fold.  No erythema, inflammation, rash.  Genitalia:     Normal external genitalia, without lesions. BUS and vagina normal. Normal bimanual exam--no cervical motion tenderness, uterus and adnexa normal, nontender, not enlarged.  Rectal:     Normal sphincter tone, no masses.  Heme negative stool  Extremities:     No clubbing, cyanosis or edema.  WHSS between 2nd and 3rd and 3rd and 4th toes on R, nontender, no swelling or erythema. Normal sensation to monofilament.  Pulses:     2+ and symmetric all extremities    Skin:     Skin color, texture, turgor normal, no rashes.  L posterior shoulder with WHSS (no longer has cyst). Hyperpigmentation at the R anterior neck  Lymph nodes:     Cervical, supraclavicular, inguinal and axillary nodes normal    Neurologic:     Normal strength, sensation and gait; reflexes 2+ and  symmetric throughout                          Psych:   Normal mood, affect, hygiene and grooming  Normal diabetic foot exam.  ***UPDATE IF BREAST/PELVIC DEFERRED TO GYN OR DONE. IF DONE, UPDATE TO REFLECT PAP SMEAR Update skin of neck--hyperpigmented R ant neck? DIABETIC FOOT EXAM   ASSESSMENT/PLAN:  COVID booster Pap? Stated last year she wasn't going back to [employer] like she saw her in March/April of this year. No records.  Pap done?? Not sure what I need to do today  I see abstracted A1c from Sept, and lipids from May, but don't see any scanned labs/notes from Dr. Talmage Nap.  We obviously got the records, did they not get scanned??  Need to know Cr, and if urine microalb done. I guess check up front for notes from September???  Need her diabetic eye exam  B12 (if not repeated by Balan) C-met, urine microalb/cr  if not done by her. Likely doesn't need CBC unless sx  Start ASA 81mg ?  Discussed monthly self breast exams and yearly mammograms; at least 30 minutes of aerobic activity at least 5 days/week, weight-bearing exercise at least 2x/week; proper sunscreen use reviewed; healthy diet, including goals of calcium and vitamin D intake and alcohol recommendations (less than or equal to 1 drink/day) reviewed; regular seatbelt use; changing batteries in smoke detectors.   Immunization recommendations discussed--continue yearly flu shots.  COVID booster given today.  Colonoscopy recommendations reviewed--UTD. Repeat in 10/2027   F/u 1 year, sooner prn.

## 2023-11-15 ENCOUNTER — Other Ambulatory Visit: Payer: Self-pay | Admitting: *Deleted

## 2023-11-15 ENCOUNTER — Ambulatory Visit (INDEPENDENT_AMBULATORY_CARE_PROVIDER_SITE_OTHER): Payer: BC Managed Care – PPO | Admitting: Family Medicine

## 2023-11-15 ENCOUNTER — Encounter: Payer: Self-pay | Admitting: *Deleted

## 2023-11-15 ENCOUNTER — Encounter: Payer: Self-pay | Admitting: Family Medicine

## 2023-11-15 VITALS — BP 110/60 | HR 80 | Ht 64.0 in | Wt 137.6 lb

## 2023-11-15 DIAGNOSIS — E559 Vitamin D deficiency, unspecified: Secondary | ICD-10-CM | POA: Diagnosis not present

## 2023-11-15 DIAGNOSIS — E7800 Pure hypercholesterolemia, unspecified: Secondary | ICD-10-CM

## 2023-11-15 DIAGNOSIS — Z0000 Encounter for general adult medical examination without abnormal findings: Secondary | ICD-10-CM

## 2023-11-15 DIAGNOSIS — R931 Abnormal findings on diagnostic imaging of heart and coronary circulation: Secondary | ICD-10-CM

## 2023-11-15 DIAGNOSIS — Z5181 Encounter for therapeutic drug level monitoring: Secondary | ICD-10-CM

## 2023-11-15 DIAGNOSIS — E538 Deficiency of other specified B group vitamins: Secondary | ICD-10-CM | POA: Diagnosis not present

## 2023-11-15 DIAGNOSIS — E119 Type 2 diabetes mellitus without complications: Secondary | ICD-10-CM

## 2023-11-15 MED ORDER — ROSUVASTATIN CALCIUM 20 MG PO TABS: 20.0000 mg | ORAL_TABLET | Freq: Every day | ORAL | 2 refills | Status: DC

## 2023-11-16 LAB — VITAMIN B12: Vitamin B-12: 254 pg/mL (ref 232–1245)

## 2023-11-27 ENCOUNTER — Encounter: Payer: Self-pay | Admitting: Family Medicine

## 2023-11-27 NOTE — Telephone Encounter (Signed)
 Care team updated and letter sent for eye exam notes.

## 2024-01-26 ENCOUNTER — Encounter: Payer: Self-pay | Admitting: Family Medicine

## 2024-01-26 LAB — LIPID PANEL
Chol/HDL Ratio: 2.3 {ratio} (ref 0.0–4.4)
Cholesterol, Total: 131 mg/dL (ref 100–199)
HDL: 56 mg/dL (ref >39)
LDL Chol Calc (NIH): 65 mg/dL (ref 0–99)
Triglycerides: 44 mg/dL (ref 0–149)
VLDL Cholesterol Cal: 10 mg/dL (ref 5–40)

## 2024-01-26 LAB — HEPATIC FUNCTION PANEL
ALT: 20 [IU]/L (ref 0–32)
AST: 20 [IU]/L (ref 0–40)
Albumin: 4.1 g/dL (ref 3.8–4.9)
Alkaline Phosphatase: 64 [IU]/L (ref 44–121)
Bilirubin Total: 0.7 mg/dL (ref 0.0–1.2)
Bilirubin, Direct: 0.22 mg/dL (ref 0.00–0.40)
Total Protein: 6.6 g/dL (ref 6.0–8.5)

## 2024-01-29 MED ORDER — ROSUVASTATIN CALCIUM 20 MG PO TABS: 20.0000 mg | ORAL_TABLET | Freq: Every day | ORAL | 3 refills | Status: AC

## 2024-02-07 LAB — LAB REPORT - SCANNED
A1c: 6.9
EGFR: 98

## 2024-03-29 LAB — HEMOGLOBIN A1C: Hemoglobin A1C: 6.4

## 2024-03-29 LAB — MICROALBUMIN / CREATININE URINE RATIO: Microalb Creat Ratio: 4

## 2024-03-29 LAB — COMPREHENSIVE METABOLIC PANEL WITH GFR: eGFR: 92

## 2024-03-29 LAB — BASIC METABOLIC PANEL WITH GFR: Creatinine: 0.8 (ref 0.5–1.1)

## 2024-04-01 ENCOUNTER — Encounter: Payer: Self-pay | Admitting: *Deleted

## 2024-05-24 ENCOUNTER — Other Ambulatory Visit: Payer: Self-pay | Admitting: Family Medicine

## 2024-05-24 DIAGNOSIS — Z1231 Encounter for screening mammogram for malignant neoplasm of breast: Secondary | ICD-10-CM

## 2024-06-14 ENCOUNTER — Ambulatory Visit
Admission: RE | Admit: 2024-06-14 | Discharge: 2024-06-14 | Disposition: A | Discharge status: Home / Self Care | Payer: Self-pay | Source: Ambulatory Visit | Attending: Family Medicine | Admitting: Family Medicine

## 2024-06-14 DIAGNOSIS — Z1231 Encounter for screening mammogram for malignant neoplasm of breast: Secondary | ICD-10-CM

## 2024-09-28 LAB — LAB REPORT - SCANNED
A1c: 6.7
Albumin, Urine POC: 6.6
Creatinine, POC: 181.6 mg/dL
EGFR: 100
Microalb Creat Ratio: 4

## 2024-11-13 ENCOUNTER — Encounter: Payer: Self-pay | Admitting: Family Medicine

## 2024-11-13 ENCOUNTER — Ambulatory Visit: Admitting: Family Medicine

## 2024-11-13 ENCOUNTER — Ambulatory Visit: Payer: Self-pay

## 2024-11-13 VITALS — BP 110/70 | HR 75 | Temp 97.2°F | Wt 139.2 lb

## 2024-11-13 DIAGNOSIS — R350 Frequency of micturition: Secondary | ICD-10-CM | POA: Diagnosis not present

## 2024-11-13 DIAGNOSIS — N3000 Acute cystitis without hematuria: Secondary | ICD-10-CM

## 2024-11-13 LAB — POCT URINALYSIS DIP (PROADVANTAGE DEVICE)
Bilirubin, UA: NEGATIVE
Glucose, UA: NEGATIVE mg/dL
Ketones, POC UA: NEGATIVE mg/dL
Nitrite, UA: POSITIVE — AB
Specific Gravity, Urine: 1.01
Urobilinogen, Ur: NEGATIVE
pH, UA: 7 (ref 5.0–8.0)

## 2024-11-13 MED ORDER — NITROFURANTOIN MONOHYD MACRO 100 MG PO CAPS: 100.0000 mg | ORAL_CAPSULE | Freq: Two times a day (BID) | ORAL | 0 refills | Status: DC

## 2024-11-13 NOTE — Progress Notes (Signed)
 Chief Complaint  Patient presents with   Acute Visit    Urinary symptoms- frequency urination, just started this past weekend. Abominal cramping on side    This weekend she started with frequent urination and noted a slight cramp in her right stomach. Denies dysuria. She notes slight odor to the urine. Notes it is a little cloudy. She is voiding frequently, in small amounts. The last 3 nights she has had to get up once at night to void, which is very unusual for her.  Denies flank or back pain.  H/o UTI years ago, none recent.  DM has been well controlled. Not on farxiga or jardiance, just mounjaro.   PMH, PSH, SH reviewed  Outpatient Encounter Medications as of 11/13/2024  Medication Sig Note   acetaminophen (TYLENOL) 500 MG tablet Take 1,000 mg by mouth every 6 (six) hours as needed. 11/15/2023: As needed   cholecalciferol (VITAMIN D ) 1000 UNITS tablet Take 1,000 Units by mouth daily. Reported on 02/24/2016    Continuous Blood Gluc Sensor (FREESTYLE LIBRE 14 DAY SENSOR) MISC Apply topically every 14 (fourteen) days.    MOUNJARO 7.5 MG/0.5ML Pen Inject 7.5 mg into the skin once a week. 11/13/2024: Takes this every 10 days   rosuvastatin  (CRESTOR ) 20 MG tablet Take 1 tablet (20 mg total) by mouth daily.    [DISCONTINUED] ibuprofen (ADVIL) 200 MG tablet Take 200 mg by mouth every 6 (six) hours as needed. 11/15/2023: As needed   Lactobacillus (ACIDOPHILUS/BIFIDUS PO) Take 1 tablet by mouth daily.    [DISCONTINUED] ALPRAZolam  (XANAX ) 0.25 MG tablet Take 1-2 tablets (0.25-0.5 mg total) by mouth 3 (three) times daily as needed for anxiety. (Patient not taking: Reported on 11/15/2023) 11/15/2023: Used just with travel   [DISCONTINUED] fluticasone  (FLONASE ) 50 MCG/ACT nasal spray Place 2 sprays into both nostrils daily. (Patient not taking: Reported on 11/15/2023) 11/15/2023: Sprays on stomach for reaction from Mounjaro   [DISCONTINUED] MOUNJARO 5 MG/0.5ML Pen Inject 5 mg into the skin once a  week. 11/15/2023: Takes on Fridays   No facility-administered encounter medications on file as of 11/13/2024.   Allergies  Allergen Reactions   Minocycline Rash   Trulicity [Dulaglutide] Rash    ROS: no f/c, no flank pain. No n/v/d. No vaginal discharge.  No other complaints.  Moods are good. Sugars are good.     PHYSICAL EXAM:  BP 110/70   Pulse 75   Temp (!) 97.2 F (36.2 C)   Wt 139 lb 3.2 oz (63.1 kg)   BMI 23.89 kg/m   Pleasant, well-appearing female in no distress HEENT: conjunctiva and sclera are clear, EOMI Neck no lymphadenopathy or mass Heart: regular rate and rhythm Lungs: clear bilaterally Abdomen: soft, no mass.  Mildly tender at R lower abdomen, focal. No rebound or guarding. Nontender suprapubically. Back: no spinal or CVA tenderness Extremities: no edema Psych: normal mood, affect, hygiene, grooming Neuro: alert and oriented, cranial nerves grossly intact, normal gait    Urine:  moderate blood, nitrite + and 2+ leuks, SG 1.010, neg ketones and glucose, trace protein   ASSESSMENT/PLAN:  Acute cystitis without hematuria - treat with macrobid . Urine culture sent.  Reviewed potential reasons for UTI, and s/sx worsening infection for which f/u is needed - Plan: Urine Culture, nitrofurantoin , macrocrystal-monohydrate, (MACROBID ) 100 MG capsule  Urinary frequency - Plan: POCT Urinalysis DIP (Proadvantage Device)

## 2024-11-13 NOTE — Telephone Encounter (Signed)
 FYI Only or Action Required?: Action required by provider: request for appointment.  Patient was last seen in primary care on 11/15/2023 by Randol Dawes, MD.  Called Nurse Triage reporting urinary symptoms.  Symptoms began several days ago.  Interventions attempted: Nothing.  Symptoms are: unchanged.Urinary frequency, burning.  Triage Disposition: See Physician Within 24 Hours  Patient/caregiver understands and will follow disposition?: Yes    Copied from CRM #[id number]. Topic: Clinical - Red Word Triage >> Nov 13, 2024  8:31 AM Amy B wrote: Red Word that prompted transfer to Nurse Triage: Burning, increase in frequency, possible UTI Reason for Disposition  Urinating more frequently than usual (i.e., frequency) OR new-onset of the feeling of an urgent need to urinate (i.e., urgency)  Answer Assessment - Initial Assessment Questions 1. SYMPTOM: What's the main symptom you're concerned about? (e.g., frequency, incontinence)     Burning, frequency 2. ONSET: When did the    start?     This week 3. PAIN: Is there any pain? If Yes, ask: How bad is it? (Scale: 1-10; mild, moderate, severe)     moderate 4. CAUSE: What do you think is causing the symptoms?     UTI 5. OTHER SYMPTOMS: Do you have any other symptoms? (e.g., blood in urine, fever, flank pain, pain with urination)     NO 6. PREGNANCY: Is there any chance you are pregnant? When was your last menstrual period?     NO  Protocols used: Urinary Symptoms-A-AH

## 2024-11-16 LAB — URINE CULTURE

## 2024-11-17 ENCOUNTER — Ambulatory Visit: Payer: Self-pay | Admitting: Family Medicine

## 2024-11-27 NOTE — Progress Notes (Signed)
 Chief Complaint  Patient presents with   Annual Exam    Nonfasting annual exam. Saw Dr. Madolyn Monte and is UTD, I will get records. Went to [facility] 10/07/24 I will also get report. Had flu and covid vaccines 10/04/24, ok for prevnar today. No new concerns.    Vanessa [name] is a 62 female who presents for a complete physical.    She was seen a few weeks ago with UTI, treated with macrobid .  (E.coli on culture, pan-sensitive). Symptoms have resolved completely.  She is under the care of GYN, and is care is up to date. Records have not been received. She has fibroids, and is s/p ablation.   Denies hot flashes or night sweats. Denies vaginal dryness, discharge or discomfort.   Diabetes:  Under the care of Dr. Tommas. She is currently on Mounjaro 7.5mg  every 10 days. She felt like she had too much weight loss when she took it weekly.  Last A1c was 6.7% in 09/2024. She checks her feet regularly, denies lesions.  She sees podiatrist in [place], but not recently. She still has a little discomfort in her R foot (since her surgery), no further surgery was recommended.  Using a toe sleeve helps keep it from swelling Denies polydipsia, polyuria, hypoglycemia (rare at night, related to sleep position).  Last eye exam was 09/2024, records not received.  Hyperlipidemia:  Simvastatin was changed to rosuvastatin  last year, as LDL was above goal.  Lipids were checked by Dr. Tommas, and LDL was 60, (goal <70). She is tolerating rosuvastatin  without side effects. She continues to follow low cholesterol diet. She had coronary calcium  scan 10/2023. IMPRESSION: Coronary calcium  score of 35.6. This was 45 percentile for age-, race-, and sex-matched controls.  She was noted to have borderline low B12 levels, when seen in May 2023 (level of 268) and complaining of fatigue.  B12 supplement was recommended. Last check was at physical last year, hadn't been taking any supplement, and B12 was low  at 254 (had been 266 the year prior). She has been off metformin (in [place] [employer]) since about August 2024. She was advised to either take a multivitamin or B complex vitamin that has B12, or a separate B12 vitamin, once daily.  She is currently taking a multivitamin daily.  Lab Results  Component Value Date   VITAMINB12 254 11/15/2023   H/o vitamin D  deficiency.  Level was checked by Dr. Tommas in 09/2024 and was normal.  She continues to take daily supplement of 1000 IU.    Immunization History  Administered Date(s) Administered   Influenza Split 10/21/2009, 10/12/2011, 09/05/2012, 09/04/2014   Influenza, Quadrivalent, Recombinant, Inj, Pf 09/29/2017   Influenza,inj,Quad PF,6+ Mos 08/27/2015, 08/31/2016, 09/06/2020, 09/09/2021, 10/19/2022   Influenza-Unspecified 09/15/2019, 09/20/2023, 10/04/2024   Moderna Covid-19 Vaccine Bivalent Booster 26yrs & up 10/11/2021   Moderna Sars-Covid-2 Vaccination 02/20/2020, 03/27/2020, 12/06/2020   Pfizer(Comirnaty)Fall Seasonal Vaccine 12 years and older 10/19/2022, 10/19/2023, 10/04/2024   Pneumococcal Conjugate-13 10/02/2019   Pneumococcal Polysaccharide-23 07/20/2007   Td 06/30/2016   Tdap 06/18/2006   Zoster Recombinant(Shingrix ) 10/15/2019, 12/18/2019   Last Pap smear: 08/2020 normal (by GYN). She reports having one more recently with [place]. Last mammogram: 05/2024 Last colonoscopy: 10/2017, normal, Dr. Kristie. Repeat 10 years Last DEXA: never Dentist: twice yearly Ophtho: yearly, went recently.  Exercise:  nothing regular recently, other than walking some across campus. She has been very busy with work. Plays softball 2x/week, August through mid-October. She walked daily with a friend  over the summer. No weight-bearing exercise. She has a gym at work she can use.   PMH, PSH, SH and FH were updated and reviewed.  Outpatient Encounter Medications as of 11/28/2024  Medication Sig Note   cholecalciferol (VITAMIN D ) 1000 UNITS tablet Take 1,000  Units by mouth daily. Reported on 02/24/2016    Continuous Blood Gluc Sensor (FREESTYLE LIBRE 14 DAY SENSOR) MISC Apply topically every 14 (fourteen) days.    Lactobacillus (ACIDOPHILUS/BIFIDUS PO) Take 1 tablet by mouth daily.    MOUNJARO 7.5 MG/0.5ML Pen Inject 7.5 mg into the skin once a week. 11/28/2024: Every 10 days   Multiple Vitamin (MULTIVITAMIN) tablet Take 1 tablet by mouth daily.    rosuvastatin  (CRESTOR ) 20 MG tablet Take 1 tablet (20 mg total) by mouth daily.    [DISCONTINUED] acetaminophen (TYLENOL) 500 MG tablet Take 1,000 mg by mouth every 6 (six) hours as needed. 11/15/2023: As needed   [DISCONTINUED] nitrofurantoin , macrocrystal-monohydrate, (MACROBID ) 100 MG capsule Take 1 capsule (100 mg total) by mouth 2 (two) times daily.    No facility-administered encounter medications on file as of 11/28/2024.   Allergies[1]   ROS: The patient denies anorexia, fever, weight changes, headaches, vision changes, decreased hearing, ear pain, sore throat, breast concerns, chest pain, palpitations, dizziness, syncope, dyspnea on exertion, cough, swelling, nausea, vomiting, diarrhea, constipation, abdominal pain, melena, hematochezia, indigestion/heartburn, hematuria, incontinence, dysuria, vaginal bleeding, discharge, odor or itch, genital lesions, weakness, tremor, suspicious skin lesions, depression, anxiety, abnormal bleeding/bruising, or enlarged lymph nodes.   Occasional constipation. Occasional pain in back, shoulder, described as muscle aches.  Denies any pain today. Slight discomfort/tingle in R foot since her surgery. She notes a mole at her L forearm that she wants checked.  It seems to come/go.   PHYSICAL EXAM:  BP 110/60   Pulse 80   Ht 5' 4 (1.626 m)   Wt 142 lb (64.4 kg)   BMI 24.37 kg/m   Wt Readings from Last 3 Encounters:  11/28/24 142 lb (64.4 kg)  11/13/24 139 lb 3.2 oz (63.1 kg)  11/15/23 137 lb 9.6 oz (62.4 kg)    General Appearance:      Alert,  cooperative, no distress, appears stated age    Head:      Normocephalic, without obvious abnormality, atraumatic    Eyes:      PERRL, conjunctiva/corneas clear, EOM's intact, fundi benign    Ears:      Normal TM's and external ear canals    Nose:     No drainage or sinus tenderness  Throat:     Normal mucosa, no lesions  Neck:     Supple, no lymphadenopathy;  thyroid:  no enlargement/ tenderness/nodules; no carotid bruit or JVD    Back:      Spine nontender, no curvature, ROM normal, no CVA tenderness.   Lungs:       Clear to auscultation bilaterally without wheezes, rales or ronchi; respirations unlabored    Chest Wall:      No tenderness or deformity     Heart:      Regular rate and rhythm, S1 and S2 normal, no murmur, rub or gallop    Breast Exam:     Deferred to GYN  Abdomen:       Soft, non-tender, nondistended, normoactive bowel sounds, no masses, no hepatosplenomegaly.  Low transverse well healed incision.  Genitalia:     Deferred to GYN  Rectal:     Deferred to GYN  Extremities:  No clubbing, cyanosis or edema.  WHSS between 2nd and 3rd and 3rd and 4th toes on R. Normal sensation to monofilament.  Pulses:     2+ and symmetric all extremities    Skin:     Skin color, texture, turgor normal. 5 x3 mm raised, brown pigmented lesion at L forearm.  Just superior to this is a 3 x 1.4mm hypopigmented macular area.  Lymph nodes:     Cervical, supraclavicular, inguinal and axillary nodes normal    Neurologic:     Normal strength, sensation and gait; reflexes 2+ and symmetric throughout                          Psych:   Normal mood, affect, hygiene and grooming    ASSESSMENT/PLAN:   Annual physical exam  Type 2 diabetes mellitus without complication, without long-term current use of insulin (HCC) - well controlled.  Discussed adequate protein intake, strength training, and regular exercise, and poss risks/SE of meds. All questions answered  Pure hypercholesterolemia - at goal per last  check. Cont crestor  daily  Vitamin D  deficiency - adequately replaced per labs by Dr. Tommas, continue daily supplement  B12 deficiency - currently on MVI, will recheck level - Plan: Vitamin B12  Elevated coronary artery calcium  score - statin was intensified, and LDL at goal on recent check  Need for pneumococcal 20-valent conjugate vaccination - Plan: Pneumococcal conjugate vaccine 20-valent (Prevnar 20)   Discussed monthly self breast exams and yearly mammograms; at least 30 minutes of aerobic activity at least 5 days/week, weight-bearing exercise at least 2x/week; proper sunscreen use reviewed; healthy diet, including goals of calcium  and vitamin D  intake and alcohol recommendations (less than or equal to 1 drink/day) reviewed; regular seatbelt use; changing batteries in smoke detectors.   Immunization recommendations discussed--continue yearly flu shots.  Prevnar-20 given today. Colonoscopy recommendations reviewed--UTD. Repeat in 10/2027   F/u 1 year, sooner prn.      [1]  Allergies Allergen Reactions   Minocycline Rash   Trulicity [Dulaglutide] Rash

## 2024-11-27 NOTE — Patient Instructions (Addendum)
°  HEALTH MAINTENANCE RECOMMENDATIONS:  It is recommended that you get at least 30 minutes of aerobic exercise at least 5 days/week (for weight loss, you may need as much as 60-90 minutes). This can be any activity that gets your heart rate up. This can be divided in 10-15 minute intervals if needed, but try and build up your endurance at least once a week.  Weight bearing exercise is also recommended twice weekly.  Eat a healthy diet with lots of vegetables, fruits and fiber.  Colorful foods have a lot of vitamins (ie green vegetables, tomatoes, red peppers, etc).  Limit sweet tea, regular sodas and alcoholic beverages, all of which has a lot of calories and sugar.  Up to 1 alcoholic drink daily may be beneficial for women (unless trying to lose weight, watch sugars).  Drink a lot of water.  Calcium  recommendations are 1200-1500 mg daily (1500 mg for postmenopausal women or women without ovaries), and vitamin D  1000 IU daily.  This should be obtained from diet and/or supplements (vitamins), and calcium  should not be taken all at once, but in divided doses.  Monthly self breast exams and yearly mammograms for women over the age of 15 is recommended.  Sunscreen of at least SPF 30 should be used on all sun-exposed parts of the skin when outside between the hours of 10 am and 4 pm (not just when at beach or pool, but even with exercise, golf, tennis, and yard work!)  Use a sunscreen that says broad spectrum so it covers both UVA and UVB rays, and make sure to reapply every 1-2 hours.  Remember to change the batteries in your smoke detectors when changing your clock times in the spring and fall. Carbon monoxide detectors are recommended for your home.  Use your seat belt every time you are in a car, and please drive safely and not be distracted with cell phones and texting while driving.  Be sure to drink adequate water, and get adequate fiber in your diet in order to help prevent constipation.  You  can use miralax as needed as well. Ensure that you are getting at least 60-80 grams of protein daily. Work on getting weight-bearing exercise at least 2x/week, and daily cardio (150 minutes/week minimum).  Keep an eye on the mole/lesion on your left arm.  If it increases in size or changes color, or bleeds, it needs to be re-evaluated (here or with your dermatologist). Currently the shape is uniform, regular edges, and uniform coloration.  You can also take a picture with your phone to help monitor for changes.

## 2024-11-28 ENCOUNTER — Encounter: Payer: Self-pay | Admitting: Family Medicine

## 2024-11-28 ENCOUNTER — Ambulatory Visit: Payer: BC Managed Care – PPO | Admitting: Family Medicine

## 2024-11-28 VITALS — BP 110/60 | HR 80 | Ht 64.0 in | Wt 142.0 lb

## 2024-11-28 DIAGNOSIS — Z23 Encounter for immunization: Secondary | ICD-10-CM | POA: Diagnosis not present

## 2024-11-28 DIAGNOSIS — E538 Deficiency of other specified B group vitamins: Secondary | ICD-10-CM | POA: Diagnosis not present

## 2024-11-28 DIAGNOSIS — E7800 Pure hypercholesterolemia, unspecified: Secondary | ICD-10-CM

## 2024-11-28 DIAGNOSIS — R931 Abnormal findings on diagnostic imaging of heart and coronary circulation: Secondary | ICD-10-CM | POA: Diagnosis not present

## 2024-11-28 DIAGNOSIS — E119 Type 2 diabetes mellitus without complications: Secondary | ICD-10-CM

## 2024-11-28 DIAGNOSIS — E559 Vitamin D deficiency, unspecified: Secondary | ICD-10-CM | POA: Diagnosis not present

## 2024-11-28 DIAGNOSIS — Z0000 Encounter for general adult medical examination without abnormal findings: Secondary | ICD-10-CM | POA: Diagnosis not present

## 2024-11-29 ENCOUNTER — Ambulatory Visit: Payer: Self-pay | Admitting: Family Medicine

## 2024-11-29 LAB — VITAMIN B12: Vitamin B-12: 1150 pg/mL (ref 232–1245)

## 2025-12-03 ENCOUNTER — Encounter: Admitting: Family Medicine
